# Patient Record
Sex: Male | Born: 1995 | Race: White | Hispanic: Yes | Marital: Married | Smoking: Former smoker
Health system: Southern US, Community
[De-identification: ages and names within clinical notes are randomized; demographics above are authoritative.]

## PROBLEM LIST (undated history)

## (undated) DIAGNOSIS — J45909 Unspecified asthma, uncomplicated: Secondary | ICD-10-CM

## (undated) DIAGNOSIS — F419 Anxiety disorder, unspecified: Secondary | ICD-10-CM

## (undated) HISTORY — PX: NO PAST SURGERIES: SHX2092

---
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
## 2017-04-13 IMAGING — CR DG CHEST 2V
1 series · 2 of 2 positions shown · non-contrast
Comparison: Chest radiograph performed 12/18/2015

CLINICAL DATA: Acute onset of shortness of breath. Follow-up
right-sided chest tube. Initial encounter.

EXAM:
CHEST  2 VIEW

[Series 1: dg chest 2 view · 0.14mm/px · 2 of 2 slices shown]
[im 1/2]
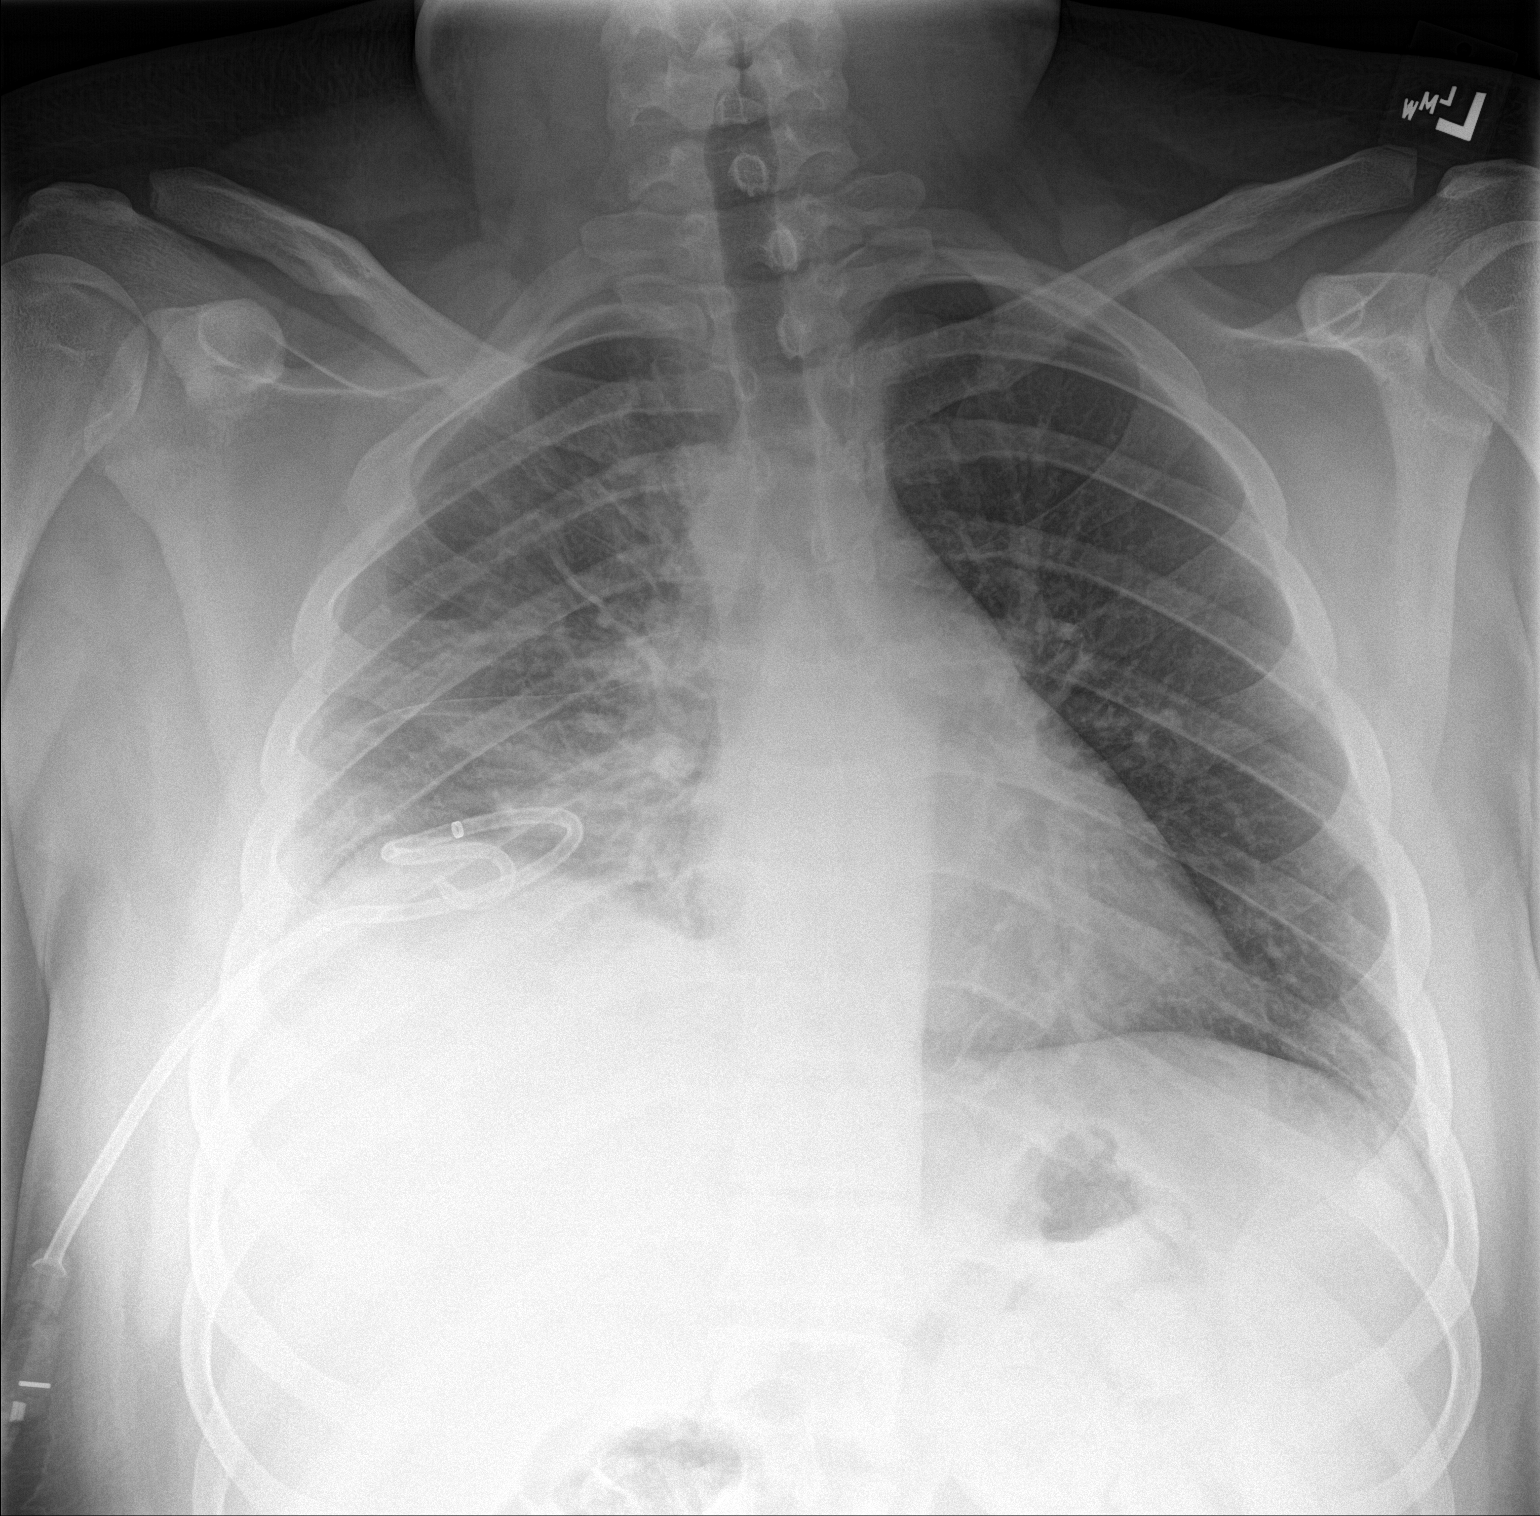
[im 2/2]
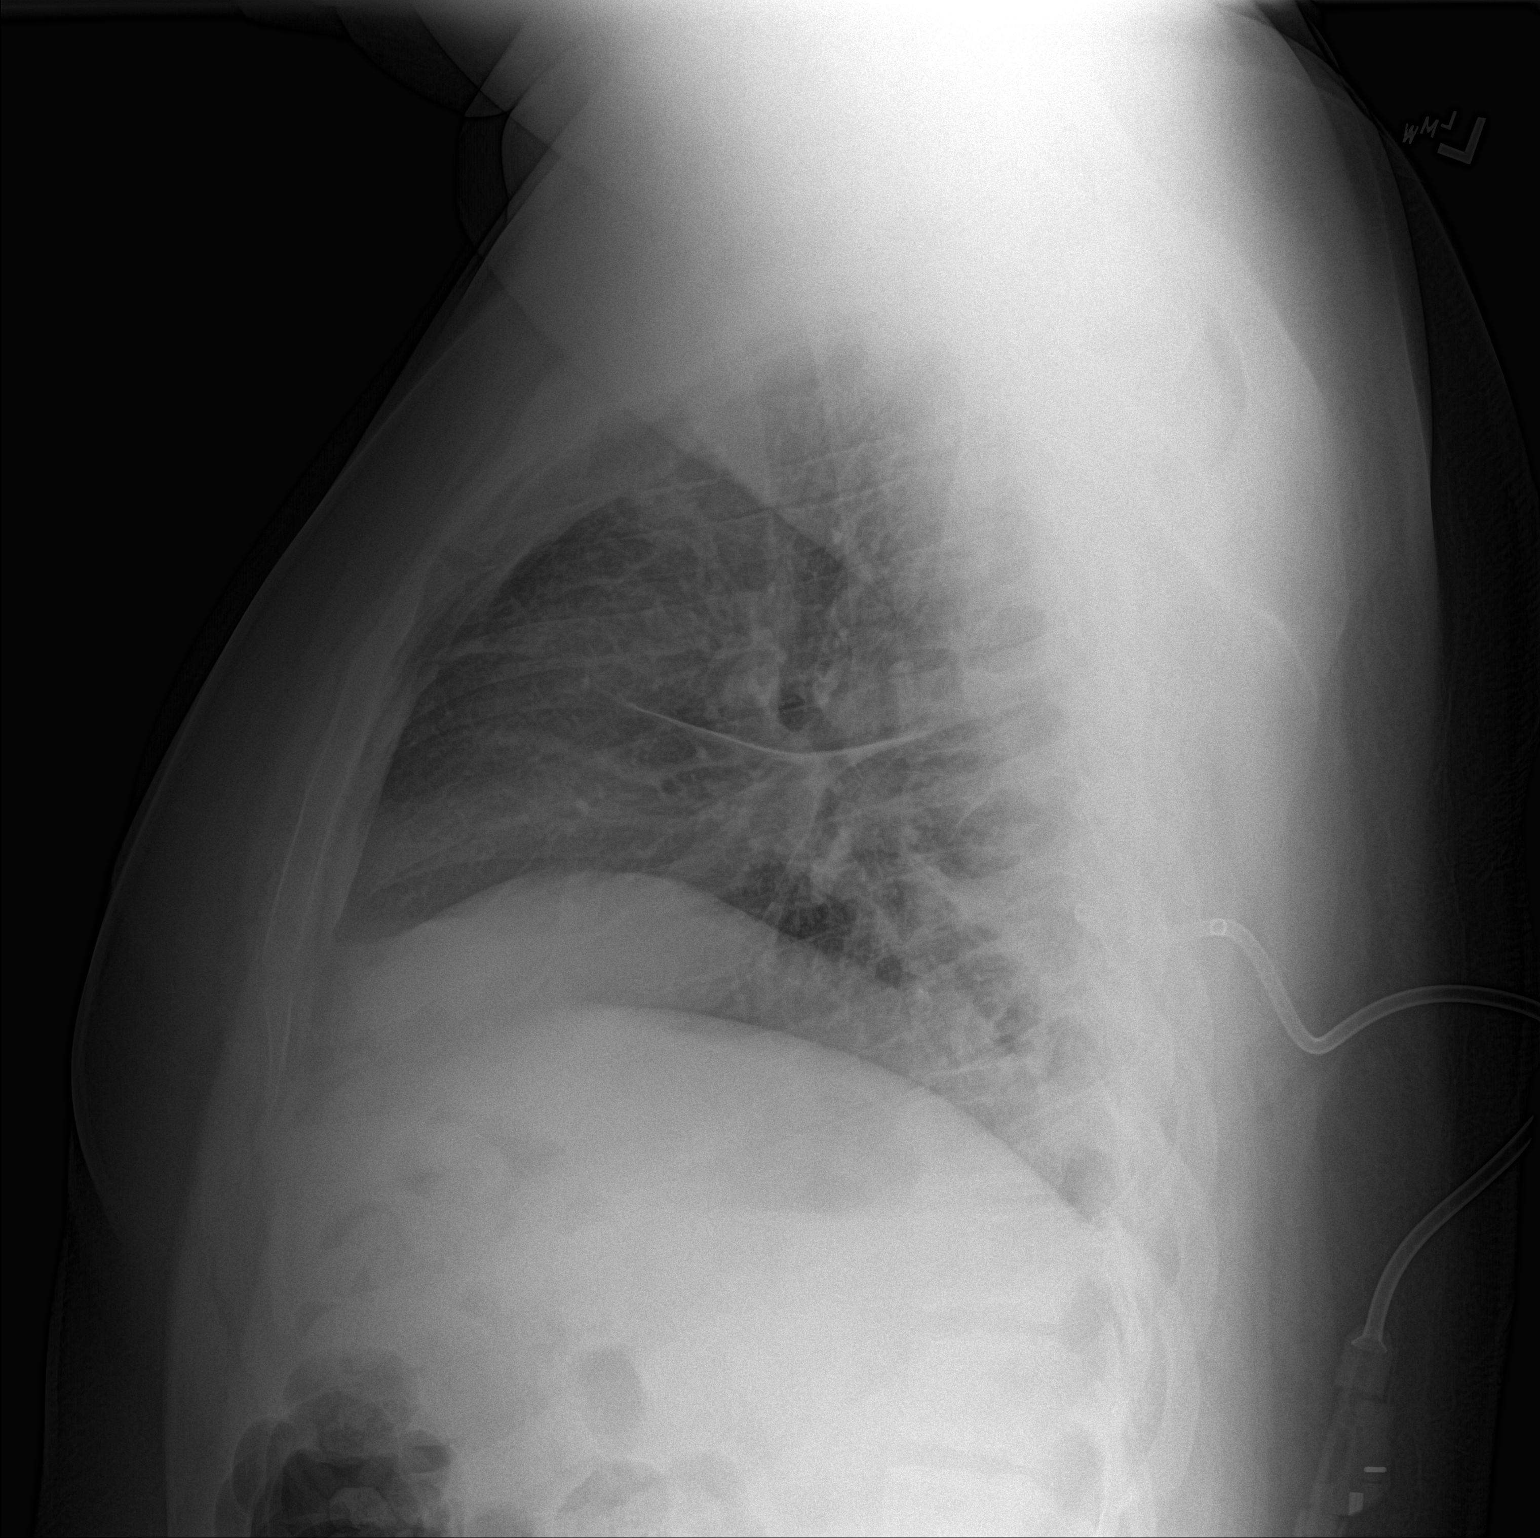

[2 of 2 positions shown; findings below may reference images not displayed]

FINDINGS: There is mild hypoexpansion of the right lung, with a right basilar
chest tube noted in generally unchanged position. Mild vascular
congestion is noted. No pleural effusion or pneumothorax is seen.
Mild right-sided opacity may reflect atelectasis or possibly mild
pneumonia.

The heart remains normal in size. No acute osseous abnormalities are
identified.
IMPRESSION: Right basilar chest tube noted in generally unchanged position.
Right lung mildly hypoexpanded. Mild right-sided airspace opacity
may reflect atelectasis or possibly mild pneumonia.

## 2017-04-15 IMAGING — CR DG CHEST 2V
1 series · 2 of 2 positions shown · non-contrast
Comparison: 12/20/2015

CLINICAL DATA: Right side chest tube

EXAM:
CHEST  2 VIEW

[Series 1: dg chest 2 view · 0.14mm/px · 2 of 2 slices shown]
[im 1/2]
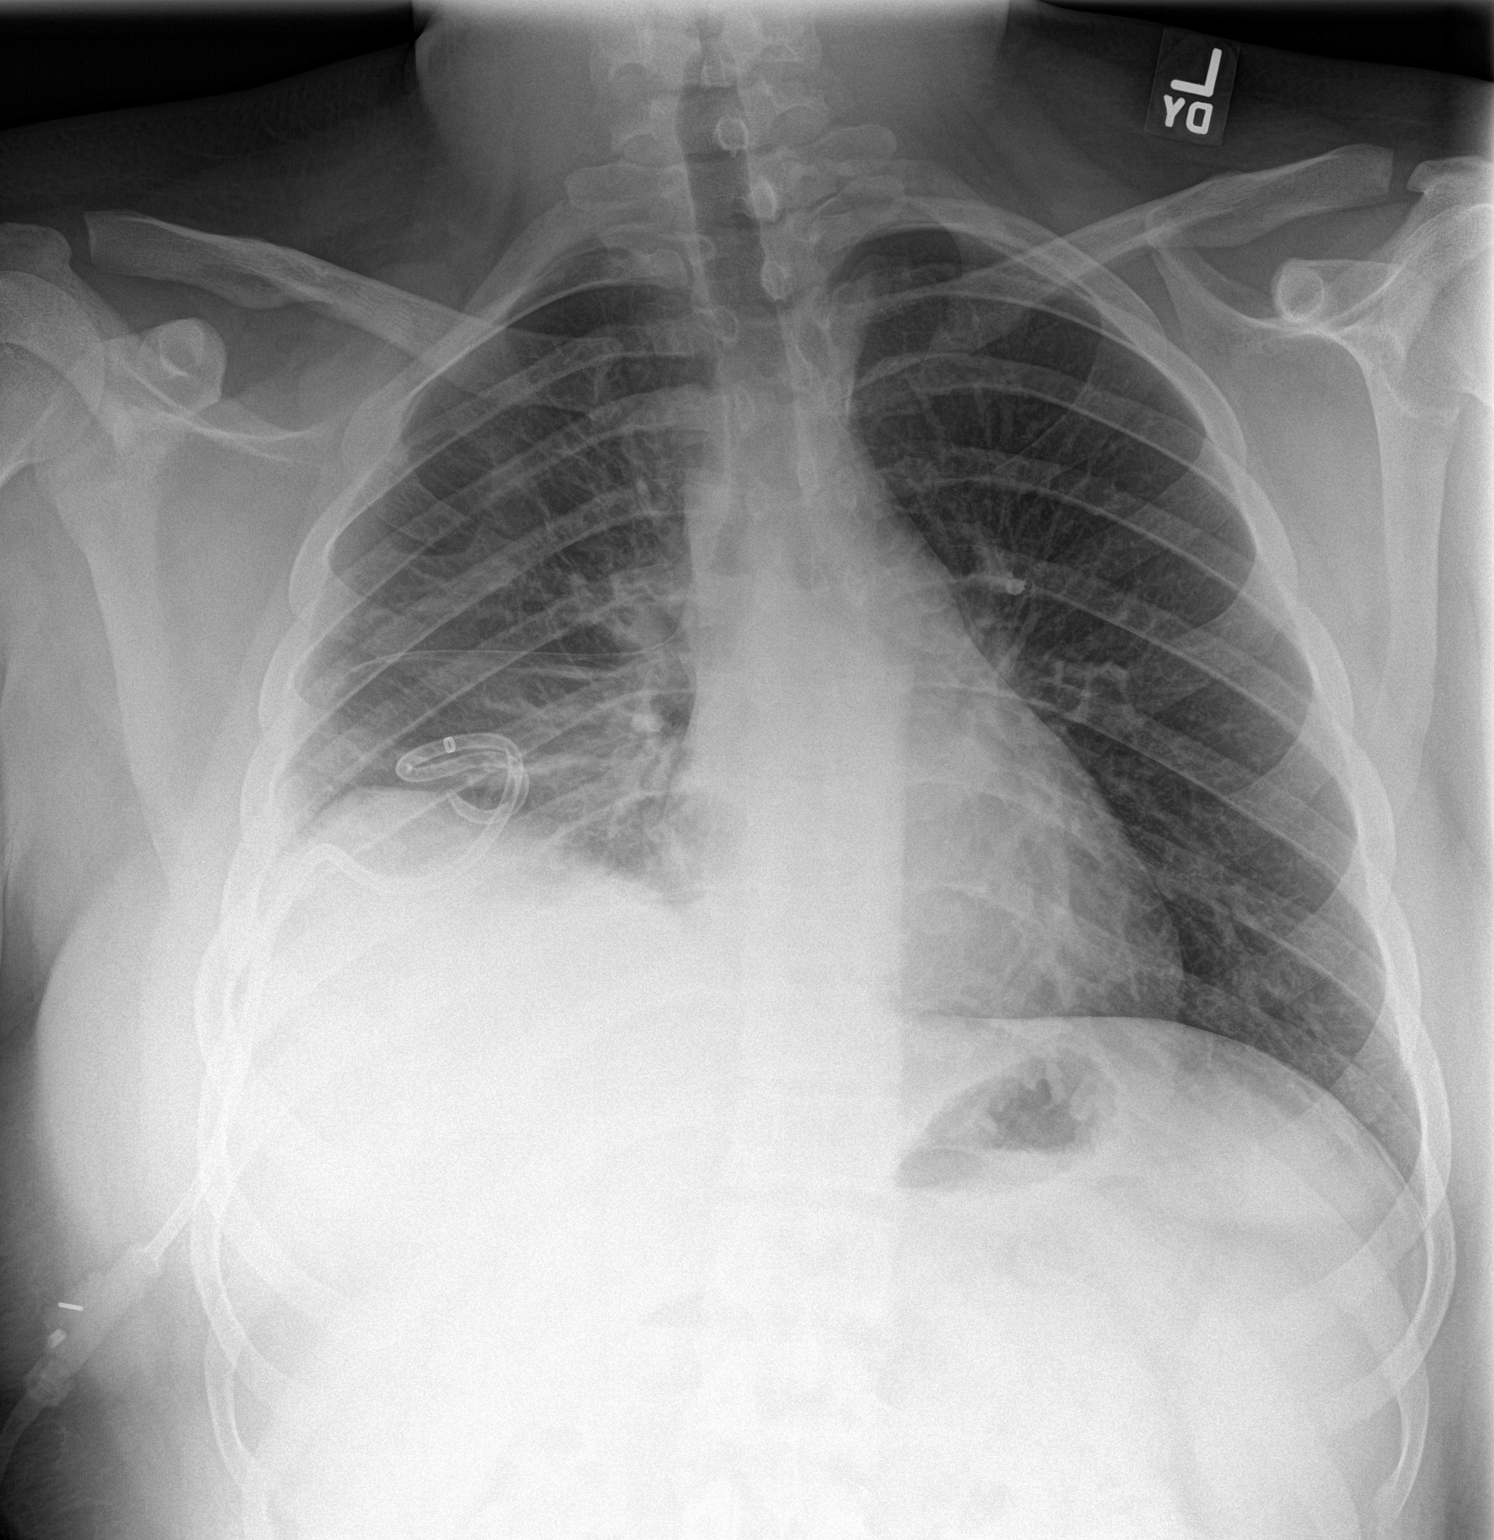
[im 2/2]
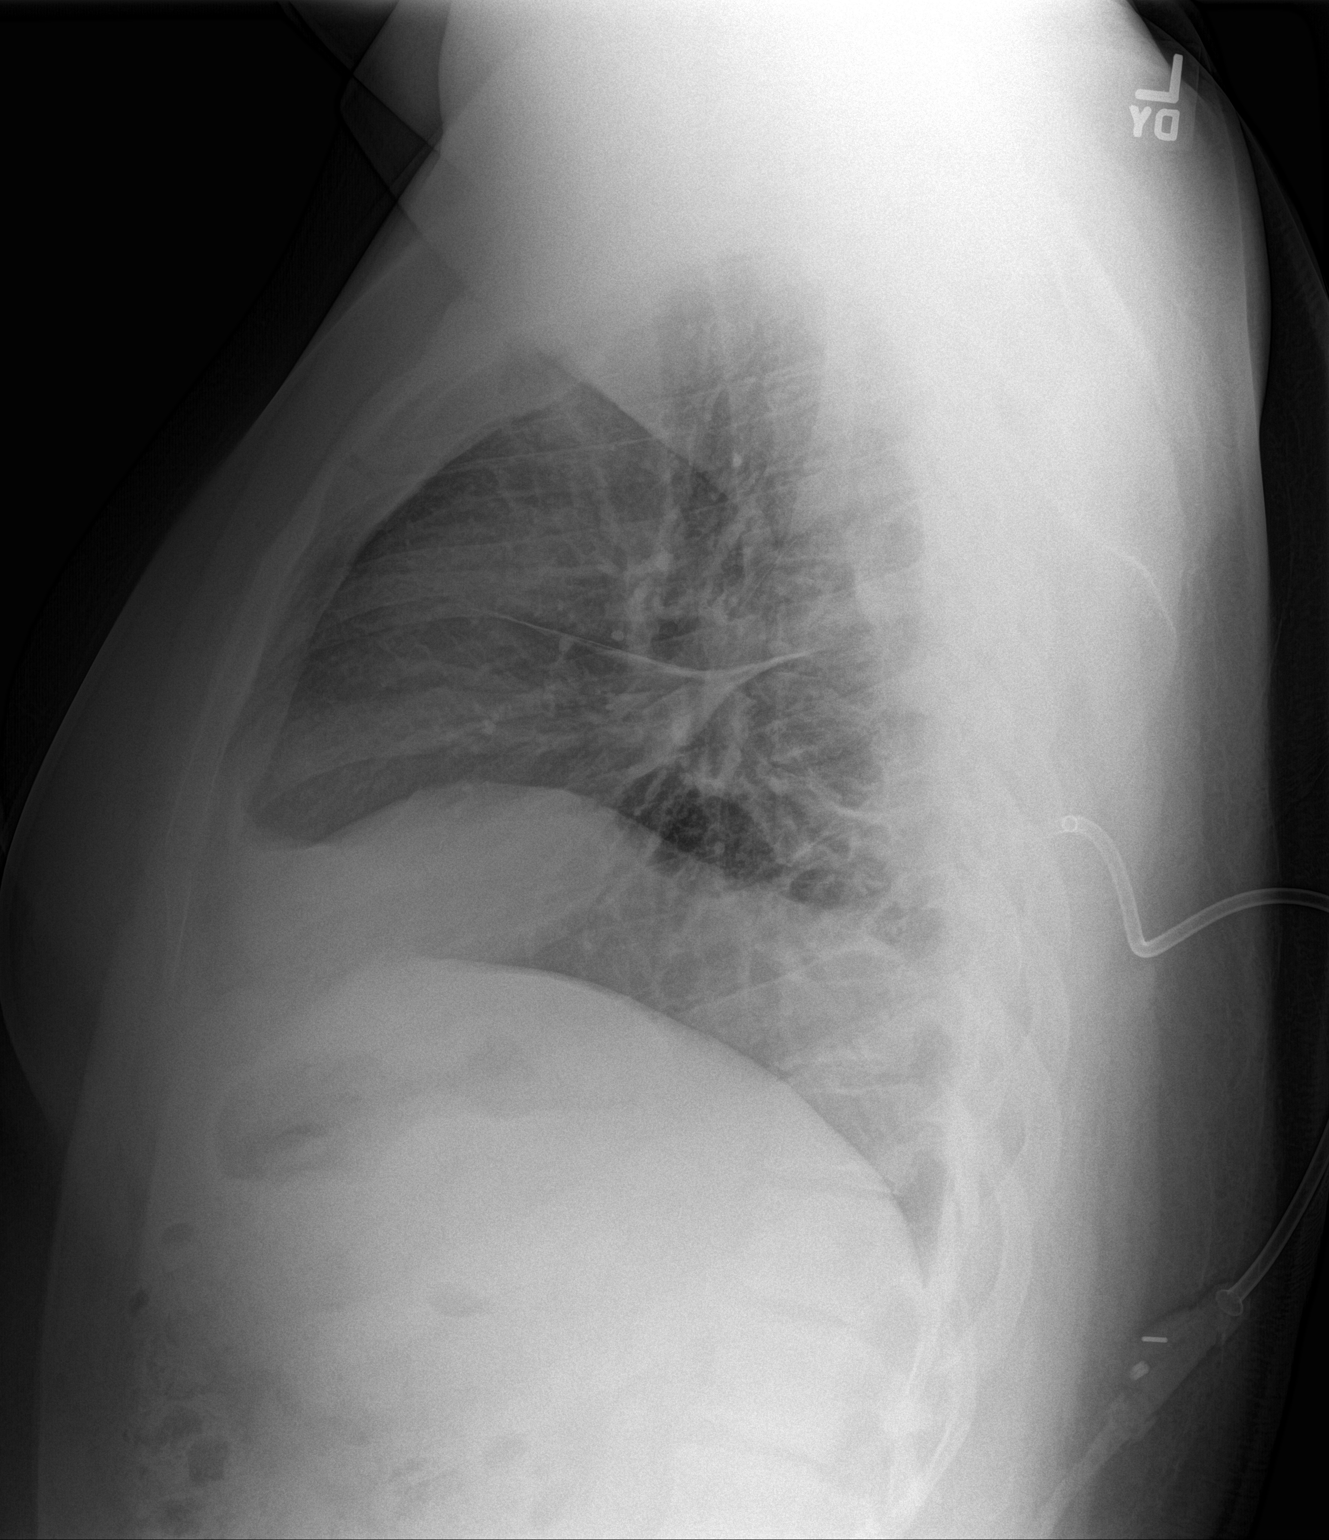

[2 of 2 positions shown; findings below may reference images not displayed]

FINDINGS: Right basilar pleural catheter is unchanged in position. Persistent
right basilar atelectasis. Small loculated right upper pleural
effusion is stable in appearance from prior exam. Left lung is
clear. No pulmonary edema. There is no pneumothorax.
IMPRESSION: Stable right base pleural catheter. Again noted right basilar
atelectasis or scarring. Small loculated right upper pleural
effusion or scarring is stable. No pulmonary edema. Left lung is
clear. No pneumothorax.

## 2017-09-29 ENCOUNTER — Other Ambulatory Visit: Payer: Self-pay

## 2017-09-29 ENCOUNTER — Emergency Department
Admission: EM | Admit: 2017-09-29 | Discharge: 2017-09-29 | Disposition: A | Discharge status: Home / Self Care | Payer: Self-pay | Attending: Emergency Medicine | Admitting: Emergency Medicine

## 2017-09-29 ENCOUNTER — Encounter: Payer: Self-pay | Admitting: Emergency Medicine

## 2017-09-29 DIAGNOSIS — F410 Panic disorder [episodic paroxysmal anxiety] without agoraphobia: Secondary | ICD-10-CM | POA: Insufficient documentation

## 2017-09-29 DIAGNOSIS — J45909 Unspecified asthma, uncomplicated: Secondary | ICD-10-CM | POA: Insufficient documentation

## 2017-09-29 DIAGNOSIS — Z79899 Other long term (current) drug therapy: Secondary | ICD-10-CM | POA: Insufficient documentation

## 2017-09-29 DIAGNOSIS — F419 Anxiety disorder, unspecified: Secondary | ICD-10-CM | POA: Insufficient documentation

## 2017-09-29 DIAGNOSIS — F17210 Nicotine dependence, cigarettes, uncomplicated: Secondary | ICD-10-CM | POA: Insufficient documentation

## 2017-09-29 HISTORY — DX: Anxiety disorder, unspecified: F41.9

## 2017-09-29 MED ORDER — ALBUTEROL SULFATE HFA 108 (90 BASE) MCG/ACT IN AERS: 2.0000 | INHALATION_SPRAY | Freq: Four times a day (QID) | RESPIRATORY_TRACT | 2 refills | Status: DC | PRN

## 2017-09-29 NOTE — ED Provider Notes (Signed)
[facility] Emergency Department Provider Note  ____________________________________________   I have reviewed the triage vital signs and the nursing notes. Where available I have reviewed prior notes and, if possible and indicated, outside hospital notes.    HISTORY  Chief Complaint Anxiety    HPI Jeffrey Joseph is a 22 y.o. male  With a history of panic attacks as well as history of asthma.  He left his asthma medications and in [place] when he moved up here and has not needed them recently but he would like a refill as a side note.  He is here because he had a panic attack after a verbal interaction with his boss at [employer].  It was not physical, he has no SI or HI, he felt he was having a panic attack he calmed down is no longer is.  He feels no other complaints at this time he does need a work note to return to work he states.    Past Medical History:  Diagnosis Date  . Anxiety   . Asthma     Patient Active Problem List   Diagnosis Date Noted  . Empyema (HCC)   . Community acquired pneumonia 12/13/2015  . Sepsis (HCC) 12/13/2015    Past Surgical History:  Procedure Laterality Date  . NO PAST SURGERIES      Prior to Admission medications   Medication Sig Start Date End Date Taking? Authorizing Provider  albuterol (PROVENTIL HFA;VENTOLIN HFA) 108 (90 Base) MCG/ACT inhaler Inhale 2 puffs into the lungs every 6 (six) hours as needed for wheezing or shortness of breath. 09/29/17   Jeanmarie Plant, MD  cefUROXime (CEFTIN) 500 MG tablet Take 1 tablet (500 mg total) by mouth 2 (two) times daily with a meal. 12/21/15   Altamese Dilling, MD  oxyCODONE-acetaminophen (ROXICET) 5-325 MG tablet Take 1 tablet by mouth every 6 (six) hours as needed for moderate pain or severe pain. 12/21/15   Altamese Dilling, MD  polyethylene glycol (MIRALAX / GLYCOLAX) packet Take 17 g by mouth daily as needed for mild constipation or moderate constipation. 12/21/15    Altamese Dilling, MD    Allergies Patient has no known allergies.  Family History  Problem Relation Age of Onset  . Diabetes Mother   . Kidney failure Mother   . Diabetes Father   . Diabetes Paternal Grandfather     Social History Social History   Tobacco Use  . Smoking status: Current Every Day Smoker    Types: Cigars, E-cigarettes  . Smokeless tobacco: Current User  Substance Use Topics  . Alcohol use: No  . Drug use: No    Review of Systems Constitutional: No fever/chills Eyes: No visual changes. ENT: No sore throat. No stiff neck no neck pain Cardiovascular: Denies chest pain. Respiratory:  short of breath when he was having a panic attack no longer is Gastrointestinal:   no vomiting.  No diarrhea.  No constipation. Genitourinary: Negative for dysuria. Musculoskeletal: Negative lower extremity swelling Skin: Negative for rash. Neurological: Negative for severe headaches, focal weakness or numbness.   ____________________________________________   PHYSICAL EXAM:  VITAL SIGNS: ED Triage Vitals  Enc Vitals Group     BP 09/29/17 2034 126/72     Pulse Rate 09/29/17 2034 77     Resp 09/29/17 2034 20     Temp 09/29/17 2034 98.7 F (37.1 C)     Temp Source 09/29/17 2034 Oral     SpO2 09/29/17 2034 98 %  Weight 09/29/17 2033 (!) 315 lb (142.9 kg)     Height 09/29/17 2033  (1.93 m)     Head Circumference --      Peak Flow --      Pain Score 09/29/17 2032 7     Pain Loc --      Pain Edu? --      Excl. in GC? --     Constitutional: Alert and oriented. Well appearing and in no acute distress. Eyes: Conjunctivae are normal Head: Atraumatic HEENT: No congestion/rhinnorhea. Mucous membranes are moist.  Oropharynx non-erythematous Neck:   Nontender with no meningismus, no masses, no stridor Cardiovascular: Normal rate, regular rhythm. Grossly normal heart sounds.  Good peripheral circulation. Respiratory: Normal respiratory effort.  No  retractions. Lungs CTAB. Abdominal: Soft and nontender. No distention. No guarding no rebound Back:  There is no focal tenderness or step off.  there is no midline tenderness there are no lesions noted. there is no CVA tenderness  Musculoskeletal: No lower extremity tenderness, no upper extremity tenderness. No joint effusions, no DVT signs strong distal pulses no edema Neurologic:  Normal speech and language. No gross focal neurologic deficits are appreciated.  Skin:  Skin is warm, dry and intact. No rash noted. Psychiatric: Mood and affect are normal. Speech and behavior are normal.  ____________________________________________   LABS (all labs ordered are listed, but only abnormal results are displayed)  Labs Reviewed - No data to display  Pertinent labs  results that were available during my care of the patient were reviewed by me and considered in my medical decision making (see chart for details). ____________________________________________  EKG  I personally interpreted any EKGs ordered by me or triage Sinus rhythm rate 71 bpm, repull abnormality noted, no acute ST elevation or depression normal axis ____________________________________________  RADIOLOGY  Pertinent labs & imaging results that were available during my care of the patient were reviewed by me and considered in my medical decision making (see chart for details). If possible, patient and/or family made aware of any abnormal findings.  No results found. ____________________________________________    PROCEDURES  Procedure(s) performed: None  Procedures  Critical Care performed: None  ____________________________________________   INITIAL IMPRESSION / ASSESSMENT AND PLAN / ED COURSE  Pertinent labs & imaging results that were available during my care of the patient were reviewed by me and considered in my medical decision making (see chart for details).  In here after what he describes as a panic  attack, he has no signs or symptoms of anything at this time, he would like a work note so he can go back to work after he had a visit verbal altercation with his boss.  We will give him prescription for albuterol is not wheezing now but he thinks that he should have it at home and he used to have it he does not have a doctor we will refer him for outpatient follow-up.  He has no SI or HI, and is safe at this time ability for discharge nothing at this time to suggest ACS PE dissection myocarditis endocarditis pneumonia pneumothorax, pericarditis etc.    ____________________________________________   FINAL CLINICAL IMPRESSION(S) / ED DIAGNOSES  Final diagnoses:  Anxiety attack      This chart was dictated using voice recognition software.  Despite best efforts to proofread,  errors can occur which can change meaning.      Jeanmarie Plant, MD 09/29/17 2145

## 2017-09-29 NOTE — Discharge Instructions (Addendum)
Return to the emergency room for any new or worrisome symptoms including chest pain, shortness of breath or if you feel worse in any way, please follow closely with primary care doctor and [facility] services.

## 2017-09-29 NOTE — ED Triage Notes (Signed)
Patient states he has h/o anxiety, feels as though he had a panic attack. States he has h/o the same, but feels like this time was worse. States he became short of breath during panic attack. 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 Patient ambulatory to triage without difficulty.

## 2018-01-01 ENCOUNTER — Other Ambulatory Visit: Payer: Self-pay

## 2018-01-01 ENCOUNTER — Emergency Department
Admission: EM | Admit: 2018-01-01 | Discharge: 2018-01-01 | Disposition: A | Discharge status: Home / Self Care | Payer: Medicaid Other | Attending: Emergency Medicine | Admitting: Emergency Medicine

## 2018-01-01 DIAGNOSIS — B9689 Other specified bacterial agents as the cause of diseases classified elsewhere: Secondary | ICD-10-CM | POA: Insufficient documentation

## 2018-01-01 DIAGNOSIS — J45909 Unspecified asthma, uncomplicated: Secondary | ICD-10-CM | POA: Insufficient documentation

## 2018-01-01 DIAGNOSIS — F17290 Nicotine dependence, other tobacco product, uncomplicated: Secondary | ICD-10-CM | POA: Insufficient documentation

## 2018-01-01 DIAGNOSIS — L089 Local infection of the skin and subcutaneous tissue, unspecified: Secondary | ICD-10-CM | POA: Insufficient documentation

## 2018-01-01 DIAGNOSIS — Z79899 Other long term (current) drug therapy: Secondary | ICD-10-CM | POA: Insufficient documentation

## 2018-01-01 MED ORDER — SULFAMETHOXAZOLE-TRIMETHOPRIM 800-160 MG PO TABS
1.0000 | ORAL_TABLET | Freq: Once | ORAL | Status: AC
  Administered 2018-01-01: 1 via ORAL
  Filled 2018-01-01: qty 1

## 2018-01-01 MED ORDER — TRAMADOL HCL 50 MG PO TABS: 50.0000 mg | ORAL_TABLET | Freq: Two times a day (BID) | ORAL | 0 refills | Status: DC | PRN

## 2018-01-01 MED ORDER — NAPROXEN 500 MG PO TABS
500.0000 mg | ORAL_TABLET | Freq: Once | ORAL | Status: AC
Start: 2018-01-01 — End: 2018-01-01
  Administered 2018-01-01: 500 mg via ORAL
  Filled 2018-01-01: qty 1

## 2018-01-01 MED ORDER — NAPROXEN 500 MG PO TABS: 500.0000 mg | ORAL_TABLET | Freq: Two times a day (BID) | ORAL | Status: DC

## 2018-01-01 MED ORDER — SULFAMETHOXAZOLE-TRIMETHOPRIM 800-160 MG PO TABS: 1.0000 | ORAL_TABLET | Freq: Two times a day (BID) | ORAL | 0 refills | Status: DC

## 2018-01-01 MED ORDER — OXYCODONE-ACETAMINOPHEN 5-325 MG PO TABS
1.0000 | ORAL_TABLET | Freq: Once | ORAL | Status: AC
Start: 2018-01-01 — End: 2018-01-01
  Administered 2018-01-01: 1 via ORAL
  Filled 2018-01-01: qty 1

## 2018-01-01 NOTE — Discharge Instructions (Signed)
Take medication as directed.

## 2018-01-01 NOTE — ED Triage Notes (Signed)
Left sided groin pain X 2 days. No injury. Denies urinary sx. Pt denies testicular pain. Denies any abnormal swelling "at the surface".  Pt alert and oriented X4, active, cooperative, pt in NAD. RR even and unlabored, color WNL.

## 2018-01-01 NOTE — ED Provider Notes (Signed)
**Note Jeffrey-Identified via Obfuscation** [facility] Emergency Department Provider Note   ____________________________________________   First MD Initiated Contact with Patient 01/01/18 1551     (approximate)  I have reviewed the triage vital signs and the nursing notes.   HISTORY  Chief Complaint Groin Pain    HPI Jeffrey Joseph is a 22 y.o. male patient complain of pain medial thigh fold area for 2 days.  Patient stated no provocative incident for complaint.  It is noted that the patient works outside and he states he perspires heavily.  Patient described the pain is "sharp".  Patient states decreased pain when he keeps his legs spread.  Rates the pain as a 8/10.  No palliative measures for complaint.  Past Medical History:  Diagnosis Date  . Anxiety   . Asthma     Patient Active Problem List   Diagnosis Date Noted  . Empyema (HCC)   . Community acquired pneumonia 12/13/2015  . Sepsis (HCC) 12/13/2015    Past Surgical History:  Procedure Laterality Date  . NO PAST SURGERIES      Prior to Admission medications   Medication Sig Start Date End Date Taking? Authorizing Provider  albuterol (PROVENTIL HFA;VENTOLIN HFA) 108 (90 Base) MCG/ACT inhaler Inhale 2 puffs into the lungs every 6 (six) hours as needed for wheezing or shortness of breath. 09/29/17   Jeanmarie PlantMcShane, James A, MD  cefUROXime (CEFTIN) 500 MG tablet Take 1 tablet (500 mg total) by mouth 2 (two) times daily with a meal. 12/21/15   Altamese DillingVachhani, Vaibhavkumar, MD  naproxen (NAPROSYN) 500 MG tablet Take 1 tablet (500 mg total) by mouth 2 (two) times daily with a meal. 01/01/18   Joni ReiningSmith, Ronald K, PA-C  oxyCODONE-acetaminophen (ROXICET) 5-325 MG tablet Take 1 tablet by mouth every 6 (six) hours as needed for moderate pain or severe pain. 12/21/15   Altamese DillingVachhani, Vaibhavkumar, MD  polyethylene glycol (MIRALAX / GLYCOLAX) packet Take 17 g by mouth daily as needed for mild constipation or moderate constipation. 12/21/15   Altamese DillingVachhani, Vaibhavkumar, MD    sulfamethoxazole-trimethoprim (BACTRIM DS,SEPTRA DS) 800-160 MG tablet Take 1 tablet by mouth 2 (two) times daily. 01/01/18   Joni ReiningSmith, Ronald K, PA-C  traMADol (ULTRAM) 50 MG tablet Take 1 tablet (50 mg total) by mouth every 12 (twelve) hours as needed. 01/01/18   Joni ReiningSmith, Ronald K, PA-C    Allergies Patient has no known allergies.  Family History  Problem Relation Age of Onset  . Diabetes Mother   . Kidney failure Mother   . Diabetes Father   . Diabetes Paternal Grandfather     Social History Social History   Tobacco Use  . Smoking status: Current Every Day Smoker    Types: Cigars, E-cigarettes  . Smokeless tobacco: Current User  Substance Use Topics  . Alcohol use: No  . Drug use: No    Review of Systems Constitutional: No fever/chills Eyes: No visual changes. ENT: No sore throat. Cardiovascular: Denies chest pain. Respiratory: Denies shortness of breath. Gastrointestinal: No abdominal pain.  No nausea, no vomiting.  No diarrhea.  No constipation. Genitourinary: Negative for dysuria. Musculoskeletal: Negative for back pain. Skin: Negative for rash.  Neurological: Negative for headaches, focal weakness or numbness.   ____________________________________________   PHYSICAL EXAM:  VITAL SIGNS: ED Triage Vitals  Enc Vitals Group     BP 01/01/18 1547 138/74     Pulse Rate 01/01/18 1547 88     Resp 01/01/18 1547 16     Temp 01/01/18 1547 98.5 F (36.9  C)     Temp Source 01/01/18 1547 Oral     SpO2 01/01/18 1547 98 %     Weight 01/01/18 1548 220 lb (99.8 kg)     Height 01/01/18 1548 6\' 4"  (1.93 m)     Head Circumference --      Peak Flow --      Pain Score 01/01/18 1548 8     Pain Loc --      Pain Edu? --      Excl. in GC? --     Constitutional: Alert and oriented. Well appearing and in no acute distress. Cardiovascular: Normal rate, regular rhythm. Grossly normal heart sounds.  Good peripheral circulation. Respiratory: Normal respiratory effort.  No  retractions. Lungs CTAB. Musculoskeletal: No lower extremity tenderness nor edema.  No joint effusions. Neurologic:  Normal speech and language. No gross focal neurologic deficits are appreciated. No gait instability. Skin: The skin at the medial thigh fold area is erythematous with moderate guarding palpation.  No palpable lesion.  Psychiatric: Mood and affect are normal. Speech and behavior are normal.  ____________________________________________   LABS (all labs ordered are listed, but only abnormal results are displayed)  Labs Reviewed - No data to display ____________________________________________  EKG   ____________________________________________  RADIOLOGY  ED MD interpretation:    Official radiology report(s): No results found.  ____________________________________________   PROCEDURES  Procedure(s) performed: None  Procedures  Critical Care performed: No  ____________________________________________   INITIAL IMPRESSION / ASSESSMENT AND PLAN / ED COURSE  As part of my medical decision making, I reviewed the following data within the electronic MEDICAL RECORD NUMBER    Bacterial skin infection left medial thigh for area.  Patient given discharge care instruction.  Patient advised take medication as directed.  Patient advised to try to keep area clean and dry.  Advised to follow-up with the community health clinic if condition persist.      ____________________________________________   FINAL CLINICAL IMPRESSION(S) / ED DIAGNOSES  Final diagnoses:  Localized bacterial skin infection     ED Discharge Orders         Ordered    sulfamethoxazole-trimethoprim (BACTRIM DS,SEPTRA DS) 800-160 MG tablet  2 times daily     01/01/18 1645    naproxen (NAPROSYN) 500 MG tablet  2 times daily with meals     01/01/18 1645    traMADol (ULTRAM) 50 MG tablet  Every 12 hours PRN     01/01/18 1645           Note:  This document was prepared using Dragon  voice recognition software and may include unintentional dictation errors.    Joni Reining, PA-C 01/01/18 1653    Merrily Brittle, MD 01/01/18 2030

## 2018-01-01 NOTE — ED Notes (Signed)
See triage note  Presents with pain to groin area for 2 days  Pain is mainly on the left  Denies any injury or urinary sx's

## 2018-07-31 ENCOUNTER — Other Ambulatory Visit: Payer: Self-pay

## 2018-07-31 ENCOUNTER — Encounter: Payer: Self-pay | Admitting: Emergency Medicine

## 2018-07-31 ENCOUNTER — Emergency Department: Payer: Medicaid Other

## 2018-07-31 ENCOUNTER — Emergency Department
Admission: EM | Admit: 2018-07-31 | Discharge: 2018-07-31 | Disposition: A | Discharge status: Home / Self Care | Payer: Medicaid Other | Attending: Emergency Medicine | Admitting: Emergency Medicine

## 2018-07-31 DIAGNOSIS — R1011 Right upper quadrant pain: Secondary | ICD-10-CM | POA: Insufficient documentation

## 2018-07-31 DIAGNOSIS — R109 Unspecified abdominal pain: Secondary | ICD-10-CM

## 2018-07-31 DIAGNOSIS — J45909 Unspecified asthma, uncomplicated: Secondary | ICD-10-CM | POA: Insufficient documentation

## 2018-07-31 DIAGNOSIS — Z79899 Other long term (current) drug therapy: Secondary | ICD-10-CM | POA: Insufficient documentation

## 2018-07-31 DIAGNOSIS — R112 Nausea with vomiting, unspecified: Secondary | ICD-10-CM

## 2018-07-31 DIAGNOSIS — R197 Diarrhea, unspecified: Secondary | ICD-10-CM | POA: Insufficient documentation

## 2018-07-31 DIAGNOSIS — F17290 Nicotine dependence, other tobacco product, uncomplicated: Secondary | ICD-10-CM | POA: Insufficient documentation

## 2018-07-31 LAB — COMPREHENSIVE METABOLIC PANEL
ALK PHOS: 49 U/L (ref 38–126)
ALT: 16 U/L (ref 0–44)
ANION GAP: 7 (ref 5–15)
AST: 18 U/L (ref 15–41)
Albumin: 4.5 g/dL (ref 3.5–5.0)
BUN: 11 mg/dL (ref 6–20)
CALCIUM: 9.2 mg/dL (ref 8.9–10.3)
CO2: 26 mmol/L (ref 22–32)
Chloride: 108 mmol/L (ref 98–111)
Creatinine, Ser: 0.8 mg/dL (ref 0.61–1.24)
Glucose, Bld: 98 mg/dL (ref 70–99)
Potassium: 4 mmol/L (ref 3.5–5.1)
SODIUM: 141 mmol/L (ref 135–145)
Total Bilirubin: 0.6 mg/dL (ref 0.3–1.2)
Total Protein: 7.4 g/dL (ref 6.5–8.1)

## 2018-07-31 LAB — CBC
HEMATOCRIT: 45.7 % (ref 39.0–52.0)
HEMOGLOBIN: 15.2 g/dL (ref 13.0–17.0)
MCH: 27.6 pg (ref 26.0–34.0)
MCHC: 33.3 g/dL (ref 30.0–36.0)
MCV: 83.1 fL (ref 80.0–100.0)
Platelets: 283 10*3/uL (ref 150–400)
RBC: 5.5 MIL/uL (ref 4.22–5.81)
RDW: 12.5 % (ref 11.5–15.5)
WBC: 8.4 10*3/uL (ref 4.0–10.5)
nRBC: 0 % (ref 0.0–0.2)

## 2018-07-31 LAB — LIPASE, BLOOD: LIPASE: 22 U/L (ref 11–51)

## 2018-07-31 MED ORDER — FAMOTIDINE 20 MG PO TABS
20.0000 mg | ORAL_TABLET | Freq: Once | ORAL | Status: AC
Start: 2018-07-31 — End: 2018-07-31
  Administered 2018-07-31: 20 mg via ORAL
  Filled 2018-07-31: qty 1

## 2018-07-31 MED ORDER — ALUM & MAG HYDROXIDE-SIMETH 200-200-20 MG/5ML PO SUSP
15.0000 mL | Freq: Once | ORAL | Status: AC
  Administered 2018-07-31: 15 mL via ORAL
  Filled 2018-07-31: qty 30

## 2018-07-31 MED ORDER — ONDANSETRON 4 MG PO TBDP
4.0000 mg | ORAL_TABLET | Freq: Once | ORAL | Status: AC
Start: 2018-07-31 — End: 2018-07-31
  Administered 2018-07-31: 4 mg via ORAL
  Filled 2018-07-31: qty 1

## 2018-07-31 MED ORDER — ONDANSETRON 4 MG PO TBDP: 4.0000 mg | ORAL_TABLET | Freq: Four times a day (QID) | ORAL | 0 refills | Status: DC | PRN

## 2018-07-31 NOTE — Discharge Instructions (Signed)
? ?  Please return to the emergency room right away if you are to develop a fever, severe nausea, your pain becomes severe or worsens, you are unable to keep food down, begin vomiting any dark or bloody fluid, you develop any dark or bloody stools, feel dehydrated, or other new concerns or symptoms arise. ? ?

## 2018-07-31 NOTE — ED Notes (Signed)
Pt states that he had vomiting x2 and diarrhea x1 today- having some sharp stomach pains in the upper right abdomen

## 2018-07-31 NOTE — ED Provider Notes (Signed)
[facility] Emergency Department Provider Note   ____________________________________________   First MD Initiated Contact with Patient 07/31/18 1538     (approximate)  I have reviewed the triage vital signs and the nursing notes.   HISTORY  Chief Complaint Emesis and Diarrhea    HPI Jeffrey Joseph is a 23 y.o. male nausea and vomiting, vomited once this morning and one loose stool nonbloody.  Reports he had a sharp discomfort in his right upper abdomen around his right lower part of the rib cage in his abdomen during the episode which occurred a couple hours ago.  Did not want to eat anything, reports he went to [facility] but did not feel like he could eat it.  No ongoing vomiting.  Slight feeling of a little bit of indigestion feeling.  No chest pain or trouble breathing.  No shortness of breath or wheezing.  No cough no fevers or chills.  Reports overall he is feeling quite a bit better right now, just has a little bit of a discomfort or soreness located just in the right upper abdomen and none in the right lower abdomen or left side   Past Medical History:  Diagnosis Date  . Anxiety   . Asthma     Patient Active Problem List   Diagnosis Date Noted  . Empyema (HCC)   . Community acquired pneumonia 12/13/2015  . Sepsis (HCC) 12/13/2015    Past Surgical History:  Procedure Laterality Date  . NO PAST SURGERIES      Prior to Admission medications   Medication Sig Start Date End Date Taking? Authorizing Provider  albuterol (PROVENTIL HFA;VENTOLIN HFA) 108 (90 Base) MCG/ACT inhaler Inhale 2 puffs into the lungs every 6 (six) hours as needed for wheezing or shortness of breath. 09/29/17   Jeanmarie Plant, MD  cefUROXime (CEFTIN) 500 MG tablet Take 1 tablet (500 mg total) by mouth 2 (two) times daily with a meal. 12/21/15   Altamese Dilling, MD  naproxen (NAPROSYN) 500 MG tablet Take 1 tablet (500 mg total) by mouth 2 (two) times daily with a  meal. 01/01/18   Joni Reining, PA-C  ondansetron (ZOFRAN ODT) 4 MG disintegrating tablet Take 1 tablet (4 mg total) by mouth every 6 (six) hours as needed for nausea or vomiting. 07/31/18   Sharyn Creamer, MD  oxyCODONE-acetaminophen (ROXICET) 5-325 MG tablet Take 1 tablet by mouth every 6 (six) hours as needed for moderate pain or severe pain. 12/21/15   Altamese Dilling, MD  polyethylene glycol (MIRALAX / GLYCOLAX) packet Take 17 g by mouth daily as needed for mild constipation or moderate constipation. 12/21/15   Altamese Dilling, MD  sulfamethoxazole-trimethoprim (BACTRIM DS,SEPTRA DS) 800-160 MG tablet Take 1 tablet by mouth 2 (two) times daily. 01/01/18   Joni Reining, PA-C  traMADol (ULTRAM) 50 MG tablet Take 1 tablet (50 mg total) by mouth every 12 (twelve) hours as needed. 01/01/18   Joni Reining, PA-C  Patient reports he is currently not taking any medication  Allergies Patient has no known allergies.  Family History  Problem Relation Age of Onset  . Diabetes Mother   . Kidney failure Mother   . Diabetes Father   . Diabetes Paternal Grandfather     Social History Social History   Tobacco Use  . Smoking status: Current Every Day Smoker    Types: Cigars, E-cigarettes  . Smokeless tobacco: Current User  Substance Use Topics  . Alcohol use: No  . Drug  use: No    Review of Systems Constitutional: No fever/chills Eyes: No visual changes. ENT: No sore throat. Cardiovascular: Denies chest pain. Respiratory: Denies shortness of breath. Gastrointestinal: See HPI Genitourinary: Negative for dysuria. Musculoskeletal: Negative for back pain. Skin: Negative for rash. Neurological: Negative for headaches, areas of focal weakness or numbness. No travel history.  No exposure to anyone with known coronavirus    ____________________________________________   PHYSICAL EXAM:  VITAL SIGNS: ED Triage Vitals  Enc Vitals Group     BP --      Pulse Rate 07/31/18  1533 79     Resp 07/31/18 1533 16     Temp 07/31/18 1533 98.3 F (36.8 C)     Temp Source 07/31/18 1533 Oral     SpO2 07/31/18 1533 98 %     Weight 07/31/18 1534 220 lb 0.3 oz (99.8 kg)     Height --      Head Circumference --      Peak Flow --      Pain Score 07/31/18 1534 7     Pain Loc --      Pain Edu? --      Excl. in GC? --   BP normal  Constitutional: Alert and oriented. Well appearing and in no acute distress. Eyes: Conjunctivae are normal. Head: Atraumatic. Nose: No congestion/rhinnorhea. Mouth/Throat: Mucous membranes are moist. Neck: No stridor.  Cardiovascular: Normal rate, regular rhythm. Grossly normal heart sounds.  Good peripheral circulation. Respiratory: Normal respiratory effort.  No retractions. Lungs CTAB. Gastrointestinal: Soft and nontender at the present time.  Patient reports his symptoms are overall much better at this time than when they were hurting him earlier.  He has no right lower quadrant tenderness.  There is no focal pain to McBurney's point.  Negative Rovsing negative psoas.  No rebound guarding or evidence of peritonitis in any quadrant no distention. Musculoskeletal: No lower extremity tenderness nor edema. Neurologic:  Normal speech and language. No gross focal neurologic deficits are appreciated.  Skin:  Skin is warm, dry and intact. No rash noted. Psychiatric: Mood and affect are normal. Speech and behavior are normal.  ____________________________________________   LABS (all labs ordered are listed, but only abnormal results are displayed)  Labs Reviewed  COMPREHENSIVE METABOLIC PANEL  LIPASE, BLOOD  CBC   ____________________________________________  EKG  ____________________________________________  RADIOLOGY  US Abdomen Limited Ruq  Result Date: 07/31/2018 CLINICAL DATA:  Abdominal pain for 1 day. EXAM: ULTRASOUND ABDOMEN LIMITED RIGHT UPPER QUADRANT COMPARISON:  None. FINDINGS: Gallbladder: No gallstones or wall  thickening visualized. No sonographic Murphy sign noted by sonographer. Common bile duct: Diameter: 3 mm Liver: No focal lesion identified. Within normal limits in parenchymal echogenicity. Portal vein is patent on color Doppler imaging with normal direction of blood flow towards the liver. IMPRESSION: Normal RIGHT upper quadrant ultrasound. Electronically Signed   By: Bary Richard M.D.   On: 07/31/2018 16:55     Right upper quadrant ultrasound normal ____________________________________________   PROCEDURES  Procedure(s) performed: None  Procedures  Critical Care performed: No  ____________________________________________   INITIAL IMPRESSION / ASSESSMENT AND PLAN / ED COURSE  Pertinent labs & imaging results that were available during my care of the patient were reviewed by me and considered in my medical decision making (see chart for details).   Differential diagnosis includes but is not limited to, abdominal perforation, aortic dissection, cholecystitis, appendicitis, diverticulitis, colitis, esophagitis/gastritis, kidney stone, pyelonephritis, urinary tract infection, viral syndrome, etc. All are considered in  decision and treatment plan. Based upon the patient's presentation and risk factors, he has a very very reassuring clinical exam at this time.  Denies right lower quadrant discomfort.  Afebrile, reports her symptoms overall much improved.  Given the nature of right upper quadrant discomfort nausea vomiting 1 loose stool and a sharp discomfort that accompanied it in the right upper abdomen earlier we will evaluate further for hepatobiliary process.  ----------------------------------------- 5:09 PM on 07/31/2018 -----------------------------------------  Reassuring lab work.  Patient reports pain and symptom-free.  Reexam abdomen soft nontender no pain in any quadrant.  Return precautions and treatment recommendations and follow-up discussed with the patient who is  agreeable with the plan.  Suspect possibly some type of food poisoning, gastritis, or viral type etiology as he has very reassuring lab work and his exam.  He responded well to medication he feels normal now.       ____________________________________________   FINAL CLINICAL IMPRESSION(S) / ED DIAGNOSES  Final diagnoses:  Abdominal pain  Nausea vomiting and diarrhea        Note:  This document was prepared using Dragon voice recognition software and may include unintentional dictation errors       Sharyn CreamerQuale, Raphaella Larkin, MD 07/31/18 1709

## 2018-07-31 NOTE — ED Triage Notes (Signed)
Pt to ed with c/o n/v/d.  Pt states diarrhea x 2, states vomiting x 2.

## 2018-12-13 ENCOUNTER — Emergency Department
Admission: EM | Admit: 2018-12-13 | Discharge: 2018-12-13 | Disposition: A | Discharge status: Home / Self Care | Payer: BLUE CROSS/BLUE SHIELD | Attending: Emergency Medicine | Admitting: Emergency Medicine

## 2018-12-13 ENCOUNTER — Other Ambulatory Visit: Payer: Self-pay

## 2018-12-13 ENCOUNTER — Encounter: Payer: Self-pay | Admitting: Emergency Medicine

## 2018-12-13 DIAGNOSIS — Y939 Activity, unspecified: Secondary | ICD-10-CM | POA: Insufficient documentation

## 2018-12-13 DIAGNOSIS — Z23 Encounter for immunization: Secondary | ICD-10-CM | POA: Insufficient documentation

## 2018-12-13 DIAGNOSIS — Y999 Unspecified external cause status: Secondary | ICD-10-CM | POA: Insufficient documentation

## 2018-12-13 DIAGNOSIS — W57XXXA Bitten or stung by nonvenomous insect and other nonvenomous arthropods, initial encounter: Secondary | ICD-10-CM | POA: Insufficient documentation

## 2018-12-13 DIAGNOSIS — Y92193 Bedroom in other specified residential institution as the place of occurrence of the external cause: Secondary | ICD-10-CM | POA: Insufficient documentation

## 2018-12-13 DIAGNOSIS — Z79899 Other long term (current) drug therapy: Secondary | ICD-10-CM | POA: Insufficient documentation

## 2018-12-13 DIAGNOSIS — S30860A Insect bite (nonvenomous) of lower back and pelvis, initial encounter: Secondary | ICD-10-CM | POA: Diagnosis not present

## 2018-12-13 DIAGNOSIS — F17210 Nicotine dependence, cigarettes, uncomplicated: Secondary | ICD-10-CM | POA: Diagnosis not present

## 2018-12-13 DIAGNOSIS — J45909 Unspecified asthma, uncomplicated: Secondary | ICD-10-CM | POA: Insufficient documentation

## 2018-12-13 MED ORDER — DIPHENHYDRAMINE HCL 25 MG PO CAPS
50.0000 mg | ORAL_CAPSULE | Freq: Once | ORAL | Status: AC
  Administered 2018-12-13: 50 mg via ORAL
  Filled 2018-12-13: qty 2

## 2018-12-13 MED ORDER — TETANUS-DIPHTH-ACELL PERTUSSIS 5-2.5-18.5 LF-MCG/0.5 IM SUSP
0.5000 mL | Freq: Once | INTRAMUSCULAR | Status: AC
  Administered 2018-12-13: 0.5 mL via INTRAMUSCULAR
  Filled 2018-12-13: qty 0.5

## 2018-12-13 MED ORDER — IBUPROFEN 600 MG PO TABS
600.0000 mg | ORAL_TABLET | Freq: Once | ORAL | Status: AC
  Administered 2018-12-13: 600 mg via ORAL

## 2018-12-13 NOTE — ED Triage Notes (Signed)
Patient states that he was bit by a brown recluse spider about 30 minutes ago. Patient with a small area of redness to left lower back.

## 2018-12-13 NOTE — ED Provider Notes (Signed)
**Note Jeffrey-Identified via Obfuscation** [facility] Emergency Department Provider Note  ____________________________________________   [facility]18 [phone]     (approximate)  I have reviewed the triage vital signs and the nursing notes.   HISTORY  Chief Complaint Insect Bite    HPI Jeffrey Joseph is a 23 y.o. male with medical history as listed below who presents for evaluation of what he believes to be a spider bite.  He says that he was lying in bed when he felt a sharp prick in his left lower back.  He reports that he felt that a couple of times and when he leaned forward and felt erratic he feels something crawling in his back.  He slept undergraduate and says that it was a brown spider although his significant other who is also in the room with says that she thinks it was a black widow.  He is right-handed and states that it is severely painful and that there is some redness around a couple of spots on his left lower back.  He denies any muscle cramps, nausea, vomiting, abdominal pain.  He has had no recent fever, sore throat, chest pain, cough, nor shortness of breath, and they have had no contact with known COVID-19 patients.  He does not know the date of his last vaccination for tetanus.         Past Medical History:  Diagnosis Date  . Anxiety   . Asthma     Patient Active Problem List   Diagnosis Date Noted  . Empyema (HCC)   . Community acquired pneumonia 12/13/2015  . Sepsis (HCC) 12/13/2015    Past Surgical History:  Procedure Laterality Date  . NO PAST SURGERIES      Prior to Admission medications   Medication Sig Start Date End Date Taking? Authorizing Provider  albuterol (PROVENTIL HFA;VENTOLIN HFA) 108 (90 Base) MCG/ACT inhaler Inhale 2 puffs into the lungs every 6 (six) hours as needed for wheezing or shortness of breath. 09/29/17   Jeanmarie PlantMcShane, James A, MD  cefUROXime (CEFTIN) 500 MG tablet Take 1 tablet (500 mg total) by mouth 2 (two) times  daily with a meal. 12/21/15   Altamese DillingVachhani, Vaibhavkumar, MD  naproxen (NAPROSYN) 500 MG tablet Take 1 tablet (500 mg total) by mouth 2 (two) times daily with a meal. 01/01/18   Joni ReiningSmith, Ronald K, PA-C  ondansetron (ZOFRAN ODT) 4 MG disintegrating tablet Take 1 tablet (4 mg total) by mouth every 6 (six) hours as needed for nausea or vomiting. 07/31/18   Sharyn CreamerQuale, Mark, MD  oxyCODONE-acetaminophen (ROXICET) 5-325 MG tablet Take 1 tablet by mouth every 6 (six) hours as needed for moderate pain or severe pain. 12/21/15   Altamese DillingVachhani, Vaibhavkumar, MD  polyethylene glycol (MIRALAX / GLYCOLAX) packet Take 17 g by mouth daily as needed for mild constipation or moderate constipation. 12/21/15   Altamese DillingVachhani, Vaibhavkumar, MD  sulfamethoxazole-trimethoprim (BACTRIM DS,SEPTRA DS) 800-160 MG tablet Take 1 tablet by mouth 2 (two) times daily. 01/01/18   Joni ReiningSmith, Ronald K, PA-C  traMADol (ULTRAM) 50 MG tablet Take 1 tablet (50 mg total) by mouth every 12 (twelve) hours as needed. 01/01/18   Joni ReiningSmith, Ronald K, PA-C    Allergies Patient has no known allergies.  Family History  Problem Relation Age of Onset  . Diabetes Mother   . Kidney failure Mother   . Diabetes Father   . Diabetes Paternal Grandfather     Social History Social History   Tobacco Use  . Smoking status:  Current Every Day Smoker    Types: Cigars, E-cigarettes  . Smokeless tobacco: Current User  Substance Use Topics  . Alcohol use: Yes    Comment: occ  . Drug use: No    Review of Systems Constitutional: No fever/chills Eyes: No visual changes. Cardiovascular: Denies chest pain. Respiratory: Denies shortness of breath. Gastrointestinal: No abdominal pain.  No nausea, no vomiting.  No diarrhea.  No constipation. Musculoskeletal: Sharp stabbing pain in the left lower back after possible insect or spider bite. Integumentary: Small area of redness and a raised bump at the site of the bite. Neurological: Negative for headaches, focal weakness or numbness.    ____________________________________________   PHYSICAL EXAM:  VITAL SIGNS: ED Triage Vitals  Enc Vitals Group     BP 12/13/18 0323 135/74     Pulse Rate 12/13/18 0323 72     Resp 12/13/18 0323 18     Temp 12/13/18 0323 98.7 F (37.1 C)     Temp Source 12/13/18 0323 Oral     SpO2 12/13/18 0323 98 %     Weight 12/13/18 0324 117.9 kg (260 lb)     Height 12/13/18 0324 1.93 m (6\' 4" )     Head Circumference --      Peak Flow --      Pain Score 12/13/18 0324 7     Pain Loc --      Pain Edu? --      Excl. in [place]? --     Constitutional: Alert and oriented. Well appearing and in no acute distress. Eyes: Conjunctivae are normal.  Cardiovascular: Normal rate, regular rhythm. Good peripheral circulation. Respiratory: Normal respiratory effort.  No retractions.  Neurologic:  Normal speech and language. No gross focal neurologic deficits are appreciated.  Skin:  Skin is warm, dry and intact.  He has a very small raised lesion on the left lower back with some very minimal surrounding erythema which could be consistent with an insect bite.  There is no hole and no evidence of fluctuance or induration.  It looks more like a small urticarial lesion for mosquito bite than anything else.  There are also 2 similar spots to the left along his lateral lower backslash side that do not have a surrounding erythema.  There is also some scratch marks around the initial lesion which suggest that he has slightly excoriated the area.  They are not consistent with or acute infection. Psychiatric: Mood and affect are a little bit anxious but otherwise unremarkable.  ____________________________________________   LABS (all labs ordered are listed, but only abnormal results are displayed)  Labs Reviewed - No data to display ____________________________________________  EKG  None - EKG not ordered by ED physician ____________________________________________  RADIOLOGY   ED MD interpretation: No  indication for imaging  Official radiology report(s): No results found.  ____________________________________________   PROCEDURES   Procedure(s) performed (including Critical Care):  Procedures   ____________________________________________   INITIAL IMPRESSION / MDM / [place] / ED COURSE  As part of my medical decision making, I reviewed the following data within the [place] notes reviewed and incorporated, Old chart reviewed, Notes from prior ED visits and [place] Controlled Substance Database   Differential diagnosis includes, but is not limited to, insect bite, spider bite, abscess.  The patient is well-appearing and in no acute distress with normal vital signs.  The patient is reporting sharp stabbing pain that is keeping him from sleeping but he does not appear  to be hurting significantly.  The presentation is not consistent with a spider bite although it is theoretically possible he could be early in the process.  However I provided reassurance and explained that treatment is really bad her symptoms are trolled.  Antibiotics are not indicated.  He does not know the date of his last tetanus vaccination so I gave him a Tdap.  I also gave him a dose of Benadryl by mouth and ibuprofen and encouraged continuing to use the over-the-counter medications.  I gave my usual customary return precautions.  I think it is very unlikely that he was bitten by a black widow or brown recluse, but regardless, I explained to him that the care is symptomatic only.  At his request I gave him a work note so that he can stay home for his insect bite today since he said that it will make it difficult for him to sleep.  He knows to come back if his symptoms get worse.          ____________________________________________  FINAL CLINICAL IMPRESSION(S) / ED DIAGNOSES  Final diagnoses:  Insect bite of lower back, initial encounter     MEDICATIONS GIVEN DURING THIS  VISIT:  Medications  ibuprofen (ADVIL) tablet 600 mg (600 mg Oral Given 12/13/18 0357)  diphenhydrAMINE (BENADRYL) capsule 50 mg (50 mg Oral Given 12/13/18 0357)  Tdap (BOOSTRIX) injection 0.5 mL (0.5 mLs Intramuscular Given 12/13/18 0358)     ED Discharge Orders    None      *Please note:  Jeffrey BurrsJacob Speelman was evaluated in Emergency Department on 12/13/2018 for the symptoms described in the history of present illness. He was evaluated in the context of the global COVID-19 pandemic, which necessitated consideration that the patient might be at risk for infection with the SARS-CoV-2 virus that causes COVID-19. Institutional protocols and algorithms that pertain to the evaluation of patients at risk for COVID-19 are in a state of rapid change based on information released by regulatory bodies including the CDC and federal and state organizations. These policies and algorithms were followed during the patient's care in the ED.  Some ED evaluations and interventions may be delayed as a result of limited staffing during the pandemic.*  Note:  This document was prepared using Dragon voice recognition software and may include unintentional dictation errors.   Loleta RoseForbach, Divon Krabill, MD 12/13/18 [phone]

## 2018-12-13 NOTE — ED Notes (Signed)
Pt states was bitten by a spider. Pt's girlfriend states "it was a black widow, i've been bitten by them a bunch". Pt without any neurological symptoms, no nausea or vomiting. Pt with small red sized area noted to posterior left back.

## 2019-05-28 ENCOUNTER — Emergency Department: Payer: 59

## 2019-05-28 ENCOUNTER — Other Ambulatory Visit: Payer: Self-pay

## 2019-05-28 ENCOUNTER — Emergency Department
Admission: EM | Admit: 2019-05-28 | Discharge: 2019-05-28 | Disposition: A | Discharge status: Home / Self Care | Payer: 59 | Attending: Student in an Organized Health Care Education/Training Program | Admitting: Student in an Organized Health Care Education/Training Program

## 2019-05-28 DIAGNOSIS — N3000 Acute cystitis without hematuria: Secondary | ICD-10-CM | POA: Diagnosis not present

## 2019-05-28 DIAGNOSIS — Z79899 Other long term (current) drug therapy: Secondary | ICD-10-CM | POA: Insufficient documentation

## 2019-05-28 DIAGNOSIS — R197 Diarrhea, unspecified: Secondary | ICD-10-CM | POA: Insufficient documentation

## 2019-05-28 DIAGNOSIS — J45909 Unspecified asthma, uncomplicated: Secondary | ICD-10-CM | POA: Insufficient documentation

## 2019-05-28 DIAGNOSIS — R109 Unspecified abdominal pain: Secondary | ICD-10-CM

## 2019-05-28 DIAGNOSIS — F17290 Nicotine dependence, other tobacco product, uncomplicated: Secondary | ICD-10-CM | POA: Insufficient documentation

## 2019-05-28 DIAGNOSIS — R1011 Right upper quadrant pain: Secondary | ICD-10-CM

## 2019-05-28 LAB — URINALYSIS, COMPLETE (UACMP) WITH MICROSCOPIC
Bilirubin Urine: NEGATIVE
Glucose, UA: NEGATIVE mg/dL
Hgb urine dipstick: NEGATIVE
Ketones, ur: NEGATIVE mg/dL
Nitrite: NEGATIVE
Protein, ur: NEGATIVE mg/dL
Specific Gravity, Urine: 1.02 (ref 1.005–1.030)
WBC, UA: >50 WBC/hpf — ABNORMAL HIGH (ref 0–5)
pH: 5 (ref 5.0–8.0)

## 2019-05-28 LAB — COMPREHENSIVE METABOLIC PANEL
ALT: 34 U/L (ref 0–44)
AST: 21 U/L (ref 15–41)
Albumin: 4.5 g/dL (ref 3.5–5.0)
Alkaline Phosphatase: 50 U/L (ref 38–126)
Anion gap: 10 (ref 5–15)
BUN: 15 mg/dL (ref 6–20)
CO2: 22 mmol/L (ref 22–32)
Calcium: 9.2 mg/dL (ref 8.9–10.3)
Chloride: 104 mmol/L (ref 98–111)
Creatinine, Ser: 0.81 mg/dL (ref 0.61–1.24)
GFR calc Af Amer: >60 mL/min (ref >60)
GFR calc non Af Amer: >60 mL/min (ref >60)
Glucose, Bld: 109 mg/dL — ABNORMAL HIGH (ref 70–99)
Potassium: 4.2 mmol/L (ref 3.5–5.1)
Sodium: 136 mmol/L (ref 135–145)
Total Bilirubin: 0.9 mg/dL (ref 0.3–1.2)
Total Protein: 7.6 g/dL (ref 6.5–8.1)

## 2019-05-28 LAB — CBC
HCT: 45.4 % (ref 39.0–52.0)
Hemoglobin: 14.9 g/dL (ref 13.0–17.0)
MCH: 27.3 pg (ref 26.0–34.0)
MCHC: 32.8 g/dL (ref 30.0–36.0)
MCV: 83.2 fL (ref 80.0–100.0)
Platelets: 268 10*3/uL (ref 150–400)
RBC: 5.46 MIL/uL (ref 4.22–5.81)
RDW: 13 % (ref 11.5–15.5)
WBC: 6.6 10*3/uL (ref 4.0–10.5)
nRBC: 0 % (ref 0.0–0.2)

## 2019-05-28 LAB — LIPASE, BLOOD: Lipase: 23 U/L (ref 11–51)

## 2019-05-28 MED ORDER — SULFAMETHOXAZOLE-TRIMETHOPRIM 800-160 MG PO TABS: 1.0000 | ORAL_TABLET | Freq: Two times a day (BID) | ORAL | 0 refills | Status: DC

## 2019-05-28 MED ORDER — DICYCLOMINE HCL 10 MG PO CAPS: 10.0000 mg | ORAL_CAPSULE | Freq: Three times a day (TID) | ORAL | 0 refills | Status: DC

## 2019-05-28 MED ORDER — FAMOTIDINE IN NACL 20-0.9 MG/50ML-% IV SOLN
20.0000 mg | Freq: Once | INTRAVENOUS | Status: AC
  Administered 2019-05-28: 20 mg via INTRAVENOUS
  Filled 2019-05-28: qty 50

## 2019-05-28 MED ORDER — SODIUM CHLORIDE 0.9% FLUSH: 3.0000 mL | Freq: Once | INTRAVENOUS | Status: DC

## 2019-05-28 MED ORDER — ONDANSETRON 4 MG PO TBDP: 4.0000 mg | ORAL_TABLET | Freq: Three times a day (TID) | ORAL | 0 refills | Status: DC | PRN

## 2019-05-28 MED ORDER — ONDANSETRON HCL 4 MG/2ML IJ SOLN
4.0000 mg | Freq: Once | INTRAMUSCULAR | Status: AC
  Administered 2019-05-28: 4 mg via INTRAVENOUS
  Filled 2019-05-28: qty 2

## 2019-05-28 MED ORDER — SODIUM CHLORIDE 0.9 % IV BOLUS
1000.0000 mL | Freq: Once | INTRAVENOUS | Status: AC
  Administered 2019-05-28: 1000 mL via INTRAVENOUS

## 2019-05-28 MED ORDER — SULFAMETHOXAZOLE-TRIMETHOPRIM 800-160 MG PO TABS
1.0000 | ORAL_TABLET | Freq: Once | ORAL | Status: AC
  Administered 2019-05-28: 17:00:00 1 via ORAL
  Filled 2019-05-28: qty 1

## 2019-05-28 NOTE — Discharge Instructions (Signed)
Your lab work and ultrasound are reassuring.  Your urine shows that you have a urinary tract infection.  Please start Bactrim for infection.  You can take Zofran to help with any nausea.  You can take Bentyl for abdominal pain.  I given you list of fluids to help with your diarrhea.  If symptoms are worsening, you may return to the emergency department.  Please call primary care on Monday for a follow-up appointment early next week.

## 2019-05-28 NOTE — ED Triage Notes (Signed)
Pt c/o abd pain with N/V/D and feeling like he was going to pass out last night while at work, denies any recent exposure to covid. Pt is in NAD, ambulatory to triage with a steady gait.,

## 2019-05-28 NOTE — ED Provider Notes (Signed)
[facility] Emergency Department Provider Note  ____________________________________________  Time seen: Approximately 1:41 PM  I have reviewed the triage vital signs and the nursing notes.   HISTORY  Chief Complaint Abdominal Pain    HPI Jeffrey Joseph is a 24 y.o. male that presents to the emergency department for evaluation of RUQ pain, 2 episodes of vomiting and 5 episodes of diarrhea since yesterday.  Patient states that this has happened previously and resolved on its own. Abdominal discomfort is primarily in the RUQ but moves around to the LUQ occasionally. Stools are watery. No blood and mucus in stools. Patient had one episode of vomiting last night and one this morning. No contacts with Covid 19. No urinary symptoms. No concern for STD or exposure to STD. He has not eaten anything out of the ordinary. No fevers.  Past Medical History:  Diagnosis Date  . Anxiety   . Asthma     Patient Active Problem List   Diagnosis Date Noted  . Empyema ([place])   . Community acquired pneumonia 12/13/2015  . Sepsis ([place]) 12/13/2015    Past Surgical History:  Procedure Laterality Date  . NO PAST SURGERIES      Prior to Admission medications   Medication Sig Start Date End Date Taking? Authorizing Provider  albuterol (PROVENTIL HFA;VENTOLIN HFA) 108 (90 Base) MCG/ACT inhaler Inhale 2 puffs into the lungs every 6 (six) hours as needed for wheezing or shortness of breath. 09/29/17   Schuyler Amor, MD  cefUROXime (CEFTIN) 500 MG tablet Take 1 tablet (500 mg total) by mouth 2 (two) times daily with a meal. 12/21/15   Vaughan Basta, MD  dicyclomine (BENTYL) 10 MG capsule Take 1 capsule (10 mg total) by mouth 4 (four) times daily -  before meals and at bedtime. 05/28/19   Laban Emperor, PA-C  naproxen (NAPROSYN) 500 MG tablet Take 1 tablet (500 mg total) by mouth 2 (two) times daily with a meal. 01/01/18   Sable Feil, PA-C  ondansetron (ZOFRAN ODT) 4 MG  disintegrating tablet Take 1 tablet (4 mg total) by mouth every 8 (eight) hours as needed for nausea or vomiting. 05/28/19   Laban Emperor, PA-C  oxyCODONE-acetaminophen (ROXICET) 5-325 MG tablet Take 1 tablet by mouth every 6 (six) hours as needed for moderate pain or severe pain. 12/21/15   Vaughan Basta, MD  polyethylene glycol (MIRALAX / GLYCOLAX) packet Take 17 g by mouth daily as needed for mild constipation or moderate constipation. 12/21/15   Vaughan Basta, MD  sulfamethoxazole-trimethoprim (BACTRIM DS) 800-160 MG tablet Take 1 tablet by mouth 2 (two) times daily. 05/28/19   Laban Emperor, PA-C  traMADol (ULTRAM) 50 MG tablet Take 1 tablet (50 mg total) by mouth every 12 (twelve) hours as needed. 01/01/18   Sable Feil, PA-C    Allergies Patient has no known allergies.  Family History  Problem Relation Age of Onset  . Diabetes Mother   . Kidney failure Mother   . Diabetes Father   . Diabetes Paternal Grandfather     Social History Social History   Tobacco Use  . Smoking status: Current Every Day Smoker    Types: Cigars, E-cigarettes  . Smokeless tobacco: Current User  Substance Use Topics  . Alcohol use: Yes    Comment: occ  . Drug use: No     Review of Systems  Constitutional: No fever/chills ENT: No upper respiratory complaints. Cardiovascular: No chest pain. Respiratory: No cough. No SOB. Gastrointestinal: Positive for abdominal  pain.  Positive for nausea and vomiting x2. Positive for diarrhea. Musculoskeletal: Negative for musculoskeletal pain. Skin: Negative for rash, abrasions, lacerations, ecchymosis. Neurological: Negative for headaches, numbness or tingling   ____________________________________________   PHYSICAL EXAM:  VITAL SIGNS: ED Triage Vitals [05/28/19 1157]  Enc Vitals Group     BP 125/83     Pulse Rate 68     Resp 18     Temp 97.9 F (36.6 C)     Temp Source Oral     SpO2 100 %     Weight 250 lb (113.4 kg)      Height 6\' 4"  (1.93 m)     Head Circumference      Peak Flow      Pain Score 8     Pain Loc      Pain Edu?      Excl. in [place]?      Constitutional: Alert and oriented. Well appearing and in no acute distress. Eyes: Conjunctivae are normal. PERRL. EOMI. Head: Atraumatic. ENT:      Ears:      Nose: No congestion/rhinnorhea.      Mouth/Throat: Mucous membranes are moist.  Neck: No stridor.   Cardiovascular: Normal rate, regular rhythm.  Good peripheral circulation. Respiratory: Normal respiratory effort without tachypnea or retractions. Lungs CTAB. Good air entry to the bases with no decreased or absent breath sounds. Gastrointestinal: Bowel sounds 4 quadrants. Soft and nontender to palpation. No guarding or rigidity. No palpable masses. No distention. No CVA tenderness. Musculoskeletal: Full range of motion to all extremities. No gross deformities appreciated. Neurologic:  Normal speech and language. No gross focal neurologic deficits are appreciated.  Skin:  Skin is warm, dry and intact. No rash noted. Psychiatric: Mood and affect are normal. Speech and behavior are normal. Patient exhibits appropriate insight and judgement.   ____________________________________________   LABS (all labs ordered are listed, but only abnormal results are displayed)  Labs Reviewed  COMPREHENSIVE METABOLIC PANEL - Abnormal; Notable for the following components:      Result Value   Glucose, Bld 109 (*)    All other components within normal limits  URINALYSIS, COMPLETE (UACMP) WITH MICROSCOPIC - Abnormal; Notable for the following components:   Color, Urine YELLOW (*)    APPearance HAZY (*)    Leukocytes,Ua LARGE (*)    WBC, UA >50 (*)    Bacteria, UA RARE (*)    All other components within normal limits  LIPASE, BLOOD  CBC   ____________________________________________  EKG   ____________________________________________  RADIOLOGY   Abdomen Limited RUQ  Result Date:  05/28/2019 CLINICAL DATA:  Acute right upper quadrant abdominal pain. EXAM: ULTRASOUND ABDOMEN LIMITED RIGHT UPPER QUADRANT COMPARISON:  July 31, 2018. FINDINGS: Gallbladder: No gallstones or wall thickening visualized. No sonographic Murphy sign noted by sonographer. Common bile duct: Diameter: 3 mm which is within normal limits. Liver: No focal lesion identified. Within normal limits in parenchymal echogenicity. Portal vein is patent on color Doppler imaging with normal direction of blood flow towards the liver. Other: None. IMPRESSION: No abnormality seen in the right upper quadrant of the abdomen. Electronically Signed   By: August 02, 2018 M.D.   On: 05/28/2019 14:49    ____________________________________________    PROCEDURES  Procedure(s) performed:    Procedures    Medications  sodium chloride flush (NS) 0.9 % injection 3 mL (3 mLs Intravenous Not Given 05/28/19 1311)  sodium chloride 0.9 % bolus 1,000 mL (0 mLs Intravenous Stopped  05/28/19 1625)  ondansetron (ZOFRAN) injection 4 mg (4 mg Intravenous Given 05/28/19 1423)  famotidine (PEPCID) IVPB 20 mg premix (0 mg Intravenous Stopped 05/28/19 1453)  sulfamethoxazole-trimethoprim (BACTRIM DS) 800-160 MG per tablet 1 tablet (1 tablet Oral Given 05/28/19 1630)     ____________________________________________   INITIAL IMPRESSION / ASSESSMENT AND PLAN / ED COURSE  Pertinent labs & imaging results that were available during my care of the patient were reviewed by me and considered in my medical decision making (see chart for details).  Review of the [place] CSRS was performed in accordance of the [facility] prior to dispensing any controlled drugs.  Differential diagnosis includes, but is not limited to, biliary disease (biliary colic, acute cholecystitis, cholangitis, choledocholithiasis, etc), intrathoracic causes for epigastric abdominal pain including ACS, gastritis, duodenitis, pancreatitis, small bowel or large bowel obstruction,  abdominal aortic aneurysm, hernia, and ulcer(s).  Patient's diagnosis is consistent with viral gastrointestinal illness and UTI. Bloodwork largely within normal limits.  Right upper quadrant ultrasound negative for acute abnormalities.  Urinalysis shows leukocytes, RBC, WBC, and rare bacteria. Urine will be sent for culture. Symptoms not consistent with nephrolithiasis. Patient declines STD testing. He is unable to give [place] a stool sample in the ED, as he has not had any diarrhea. Symptoms improved with zofran and pepcid. Patient will be discharged home with prescriptions for Bactrim, zofran and bentyl. Patient declines Covid testing. Patient is to follow up with PCP as directed. Patient is given ED precautions to return to the ED for any worsening or new symptoms.  Jeffrey Joseph was evaluated in Emergency Department on 05/28/2019 for the symptoms described in the history of present illness. He was evaluated in the context of the global COVID-19 pandemic, which necessitated consideration that the patient might be at risk for infection with the SARS-CoV-2 virus that causes COVID-19. Institutional protocols and algorithms that pertain to the evaluation of patients at risk for COVID-19 are in a state of rapid change based on information released by regulatory bodies including the CDC and federal and state organizations. These policies and algorithms were followed during the patient's care in the ED.   ____________________________________________  FINAL CLINICAL IMPRESSION(S) / ED DIAGNOSES  Final diagnoses:  Abdominal pain  Right upper quadrant abdominal pain  Diarrhea, unspecified type  Acute cystitis without hematuria      NEW MEDICATIONS STARTED DURING THIS VISIT:  ED Discharge Orders         Ordered    dicyclomine (BENTYL) 10 MG capsule  3 times daily before meals & bedtime     05/28/19 1615    ondansetron (ZOFRAN ODT) 4 MG disintegrating tablet  Every 8 hours PRN     05/28/19 1615     sulfamethoxazole-trimethoprim (BACTRIM DS) 800-160 MG tablet  2 times daily     05/28/19 1616              This chart was dictated using voice recognition software/Dragon. Despite best efforts to proofread, errors can occur which can change the meaning. Any change was purely unintentional.    Enid Derry, PA-C 05/28/19 1945    Willy Eddy, MD 05/29/19 [phone]

## 2019-05-30 LAB — URINE CULTURE: Culture: NO GROWTH

## 2019-05-31 ENCOUNTER — Ambulatory Visit
Admission: EM | Admit: 2019-05-31 | Discharge: 2019-05-31 | Disposition: A | Discharge status: Home / Self Care | Payer: 59 | Attending: Emergency Medicine | Admitting: Emergency Medicine

## 2019-05-31 ENCOUNTER — Encounter: Payer: Self-pay | Admitting: Emergency Medicine

## 2019-05-31 ENCOUNTER — Other Ambulatory Visit: Payer: Self-pay

## 2019-05-31 DIAGNOSIS — R369 Urethral discharge, unspecified: Secondary | ICD-10-CM

## 2019-05-31 DIAGNOSIS — Z202 Contact with and (suspected) exposure to infections with a predominantly sexual mode of transmission: Secondary | ICD-10-CM

## 2019-05-31 MED ORDER — DOXYCYCLINE HYCLATE 100 MG PO CAPS: 100.0000 mg | ORAL_CAPSULE | Freq: Two times a day (BID) | ORAL | 0 refills | Status: AC

## 2019-05-31 MED ORDER — CEFTRIAXONE SODIUM 500 MG IJ SOLR
500.0000 mg | Freq: Once | INTRAMUSCULAR | Status: AC
  Administered 2019-05-31: 500 mg via INTRAMUSCULAR

## 2019-05-31 NOTE — Discharge Instructions (Addendum)
You were treated with an antibiotic today called Rocephin.  Additionally, take doxycycline twice a day for 7 days.    Do not have sex for 7 days.  Your STD tests are pending.  If your test results are positive, we will call you.  You may need additional treatment and your partner(s) may also need treatment.

## 2019-05-31 NOTE — ED Provider Notes (Addendum)
Roderic Palau    CSN: [id number] Arrival date & time: 05/31/19  1150      History   Chief Complaint Chief Complaint  Patient presents with  . Penile Discharge    HPI Jeffrey Joseph is a 24 y.o. male.   Patient presents with yellow penile discharge for > 1 year.  He states he was tested for STDs and treated a year ago at another urgent care but his symptoms never cleared up.  He denies fever, chills, rash, lesions, abdominal pain, dysuria, back pain, testicular pain, or other symptoms.  The history is provided by the patient.    Past Medical History:  Diagnosis Date  . Anxiety   . Asthma     Patient Active Problem List   Diagnosis Date Noted  . Empyema ([place])   . Community acquired pneumonia 12/13/2015  . Sepsis ([place]) 12/13/2015    Past Surgical History:  Procedure Laterality Date  . NO PAST SURGERIES         Home Medications    Prior to Admission medications   Medication Sig Start Date End Date Taking? Authorizing Provider  dicyclomine (BENTYL) 10 MG capsule Take 1 capsule (10 mg total) by mouth 4 (four) times daily -  before meals and at bedtime. 05/28/19  Yes Laban Emperor, PA-C  sulfamethoxazole-trimethoprim (BACTRIM DS) 800-160 MG tablet Take 1 tablet by mouth 2 (two) times daily. 05/28/19  Yes Laban Emperor, PA-C  albuterol (PROVENTIL HFA;VENTOLIN HFA) 108 (90 Base) MCG/ACT inhaler Inhale 2 puffs into the lungs every 6 (six) hours as needed for wheezing or shortness of breath. 09/29/17   Schuyler Amor, MD  cefUROXime (CEFTIN) 500 MG tablet Take 1 tablet (500 mg total) by mouth 2 (two) times daily with a meal. 12/21/15   Vaughan Basta, MD  doxycycline (VIBRAMYCIN) 100 MG capsule Take 1 capsule (100 mg total) by mouth 2 (two) times daily for 7 days. 05/31/19 06/07/19  Sharion Balloon, NP  naproxen (NAPROSYN) 500 MG tablet Take 1 tablet (500 mg total) by mouth 2 (two) times daily with a meal. 01/01/18   Sable Feil, PA-C  ondansetron (ZOFRAN ODT) 4  MG disintegrating tablet Take 1 tablet (4 mg total) by mouth every 8 (eight) hours as needed for nausea or vomiting. 05/28/19   Laban Emperor, PA-C  oxyCODONE-acetaminophen (ROXICET) 5-325 MG tablet Take 1 tablet by mouth every 6 (six) hours as needed for moderate pain or severe pain. 12/21/15   Vaughan Basta, MD  polyethylene glycol (MIRALAX / GLYCOLAX) packet Take 17 g by mouth daily as needed for mild constipation or moderate constipation. 12/21/15   Vaughan Basta, MD  traMADol (ULTRAM) 50 MG tablet Take 1 tablet (50 mg total) by mouth every 12 (twelve) hours as needed. 01/01/18   Sable Feil, PA-C    Family History Family History  Problem Relation Age of Onset  . Diabetes Mother   . Kidney failure Mother   . Diabetes Father   . Diabetes Paternal Grandfather     Social History Social History   Tobacco Use  . Smoking status: Current Every Day Smoker    Types: Cigars, E-cigarettes  . Smokeless tobacco: Former Network engineer Use Topics  . Alcohol use: Yes    Comment: occ  . Drug use: No     Allergies   Patient has no known allergies.   Review of Systems Review of Systems  Constitutional: Negative for chills and fever.  HENT: Negative for ear pain and  sore throat.   Eyes: Negative for pain and visual disturbance.  Respiratory: Negative for cough and shortness of breath.   Cardiovascular: Negative for chest pain and palpitations.  Gastrointestinal: Negative for abdominal pain and vomiting.  Genitourinary: Positive for discharge. Negative for dysuria, flank pain, hematuria and testicular pain.  Musculoskeletal: Negative for arthralgias and back pain.  Skin: Negative for color change and rash.  Neurological: Negative for seizures and syncope.  All other systems reviewed and are negative.    Physical Exam Triage Vital Signs ED Triage Vitals  Enc Vitals Group     BP      Pulse      Resp      Temp      Temp src      SpO2      Weight      Height       Head Circumference      Peak Flow      Pain Score      Pain Loc      Pain Edu?      Excl. in GC?    No data found.  Updated Vital Signs BP 109/70 (BP Location: Right Arm)   Pulse 62   Temp 97.8 F (36.6 C) (Oral)   Resp 18   Ht 6\' 4"  (1.93 m)   Wt 250 lb (113.4 kg)   SpO2 97%   BMI 30.43 kg/m   Visual Acuity Right Eye Distance:   Left Eye Distance:   Bilateral Distance:    Right Eye Near:   Left Eye Near:    Bilateral Near:     Physical Exam Vitals and nursing note reviewed.  Constitutional:      General: He is not in acute distress.    Appearance: He is well-developed. He is not ill-appearing.  HENT:     Head: Normocephalic and atraumatic.     Mouth/Throat:     Mouth: Mucous membranes are moist.     Pharynx: Oropharynx is clear.  Eyes:     Conjunctiva/sclera: Conjunctivae normal.  Cardiovascular:     Rate and Rhythm: Normal rate and regular rhythm.     Heart sounds: No murmur.  Pulmonary:     Effort: Pulmonary effort is normal. No respiratory distress.     Breath sounds: Normal breath sounds.  Abdominal:     General: Bowel sounds are normal.     Palpations: Abdomen is soft.     Tenderness: There is no abdominal tenderness. There is no right CVA tenderness, left CVA tenderness, guarding or rebound.  Genitourinary:    Penis: Normal.      Testes: Normal.  Musculoskeletal:     Cervical back: Neck supple.  Skin:    General: Skin is warm and dry.     Findings: No rash.  Neurological:     General: No focal deficit present.     Mental Status: He is alert and oriented to person, place, and time.  Psychiatric:        Mood and Affect: Mood normal.        Behavior: Behavior normal.      UC Treatments / Results  Labs (all labs ordered are listed, but only abnormal results are displayed) Labs Reviewed  CYTOLOGY, (ORAL, ANAL, URETHRAL) ANCILLARY ONLY    EKG   Radiology No results found.  Procedures Procedures (including critical care time)   Medications Ordered in UC Medications  cefTRIAXone (ROCEPHIN) injection 500 mg (500 mg Intramuscular Given 05/31/19 1243)  Initial Impression / Assessment and Plan / UC Course  I have reviewed the triage vital signs and the nursing notes.  Pertinent labs & imaging results that were available during my care of the patient were reviewed by me and considered in my medical decision making (see chart for details).    Penile discharge, exposure to STD.  Urethral swab obtained.  Treated today with Rocephin and doxycycline.  Instructed patient to refrain from sexual activity x7 days.  Discussed with him that if his STD test are positive requiring additional treatment, we will call him.  Discussed that he and his sexual partner may require additional treatment at that time.  Patient agrees to plan of care.     Final Clinical Impressions(s) / UC Diagnoses   Final diagnoses:  Penile discharge  Exposure to STD     Discharge Instructions     You were treated with an antibiotic today called Rocephin.  Additionally, take doxycycline twice a day for 7 days.    Do not have sex for 7 days.  Your STD tests are pending.  If your test results are positive, we will call you.  You may need additional treatment and your partner(s) may also need treatment.         ED Prescriptions    Medication Sig Dispense Auth. Provider   doxycycline (VIBRAMYCIN) 100 MG capsule Take 1 capsule (100 mg total) by mouth 2 (two) times daily for 7 days. 14 capsule Mickie Bail, NP     PDMP not reviewed this encounter.   Mickie Bail, NP 05/31/19 1245    Mickie Bail, NP 05/31/19 1246

## 2019-05-31 NOTE — ED Triage Notes (Signed)
Pt c/o yellow penile discharge. Started about a year ago. He states he was treated but never cleared up.

## 2019-06-01 LAB — CYTOLOGY, (ORAL, ANAL, URETHRAL) ANCILLARY ONLY
Chlamydia: NEGATIVE
Neisseria Gonorrhea: NEGATIVE
Trichomonas: POSITIVE — AB

## 2019-06-02 ENCOUNTER — Telehealth (HOSPITAL_COMMUNITY): Payer: Self-pay | Admitting: Emergency Medicine

## 2019-06-02 MED ORDER — METRONIDAZOLE 500 MG PO TABS: 2000.0000 mg | ORAL_TABLET | Freq: Once | ORAL | 0 refills | Status: AC

## 2019-06-02 NOTE — Telephone Encounter (Signed)
Trichomonas is positive. Rx  for Flagyl 2 grams, once was sent to the pharmacy of record. Pt needs education to refrain from sexual intercourse for 7 days to give the medicine time to work. Sexual partners need to be notified and tested/treated. Condoms may reduce risk of reinfection. Recheck for further evaluation if symptoms are not improving.   Patient contacted by phone and made aware of    results. Pt verbalized understanding and had all questions answered.    

## 2019-06-21 ENCOUNTER — Ambulatory Visit
Admission: EM | Admit: 2019-06-21 | Discharge: 2019-06-21 | Disposition: A | Discharge status: Home / Self Care | Payer: 59 | Attending: Emergency Medicine | Admitting: Emergency Medicine

## 2019-06-21 ENCOUNTER — Other Ambulatory Visit: Payer: Self-pay

## 2019-06-21 ENCOUNTER — Encounter: Payer: Self-pay | Admitting: Emergency Medicine

## 2019-06-21 DIAGNOSIS — Z113 Encounter for screening for infections with a predominantly sexual mode of transmission: Secondary | ICD-10-CM | POA: Diagnosis present

## 2019-06-21 NOTE — Discharge Instructions (Addendum)
Your STD tests are pending.  If your test results are positive, we will call you.  You may need treatment and your partner(s) may also need treatment.  Do not have sex until your test results are back.

## 2019-06-21 NOTE — ED Provider Notes (Signed)
Jeffrey Joseph    CSN: [id number] Arrival date & time: 06/21/19  1543      History   Chief Complaint Chief Complaint  Patient presents with  . STD check    HPI Jeffrey Joseph is a 24 y.o. male.   Patient presents with request for STD testing.  He was seen here on 05/31/2019; tested positive for trichomonas and was treated.  He denies current symptoms, including penile discharge, testicular pain, abdominal pain, dysuria, back pain, rash, lesions, or other symptoms.  The history is provided by the patient.    Past Medical History:  Diagnosis Date  . Anxiety   . Asthma     Patient Active Problem List   Diagnosis Date Noted  . Empyema ([place])   . Community acquired pneumonia 12/13/2015  . Sepsis ([place]) 12/13/2015    Past Surgical History:  Procedure Laterality Date  . NO PAST SURGERIES         Home Medications    Prior to Admission medications   Medication Sig Start Date End Date Taking? Authorizing Provider  albuterol (PROVENTIL HFA;VENTOLIN HFA) 108 (90 Base) MCG/ACT inhaler Inhale 2 puffs into the lungs every 6 (six) hours as needed for wheezing or shortness of breath. 09/29/17   Schuyler Amor, MD  cefUROXime (CEFTIN) 500 MG tablet Take 1 tablet (500 mg total) by mouth 2 (two) times daily with a meal. 12/21/15   Vaughan Basta, MD  dicyclomine (BENTYL) 10 MG capsule Take 1 capsule (10 mg total) by mouth 4 (four) times daily -  before meals and at bedtime. 05/28/19   Laban Emperor, PA-C  naproxen (NAPROSYN) 500 MG tablet Take 1 tablet (500 mg total) by mouth 2 (two) times daily with a meal. 01/01/18   Sable Feil, PA-C  ondansetron (ZOFRAN ODT) 4 MG disintegrating tablet Take 1 tablet (4 mg total) by mouth every 8 (eight) hours as needed for nausea or vomiting. 05/28/19   Laban Emperor, PA-C  oxyCODONE-acetaminophen (ROXICET) 5-325 MG tablet Take 1 tablet by mouth every 6 (six) hours as needed for moderate pain or severe pain. 12/21/15   Vaughan Basta, MD  polyethylene glycol (MIRALAX / GLYCOLAX) packet Take 17 g by mouth daily as needed for mild constipation or moderate constipation. 12/21/15   Vaughan Basta, MD  sulfamethoxazole-trimethoprim (BACTRIM DS) 800-160 MG tablet Take 1 tablet by mouth 2 (two) times daily. 05/28/19   Laban Emperor, PA-C  traMADol (ULTRAM) 50 MG tablet Take 1 tablet (50 mg total) by mouth every 12 (twelve) hours as needed. 01/01/18   Sable Feil, PA-C    Family History Family History  Problem Relation Age of Onset  . Diabetes Mother   . Kidney failure Mother   . Diabetes Father   . Diabetes Paternal Grandfather     Social History Social History   Tobacco Use  . Smoking status: Current Every Day Smoker    Packs/day: 0.25    Types: Cigarettes  . Smokeless tobacco: Former Systems developer  . Tobacco comment: 3 cig/day  Substance Use Topics  . Alcohol use: Yes    Comment: occ  . Drug use: No     Allergies   Patient has no known allergies.   Review of Systems Review of Systems  Constitutional: Negative for chills and fever.  HENT: Negative for ear pain and sore throat.   Eyes: Negative for pain and visual disturbance.  Respiratory: Negative for cough and shortness of breath.   Cardiovascular: Negative for chest pain  and palpitations.  Gastrointestinal: Negative for abdominal pain and vomiting.  Genitourinary: Negative for discharge, dysuria, flank pain, hematuria and testicular pain.  Musculoskeletal: Negative for arthralgias and back pain.  Skin: Negative for color change and rash.  Neurological: Negative for seizures and syncope.  All other systems reviewed and are negative.    Physical Exam Triage Vital Signs ED Triage Vitals  Enc Vitals Group     BP      Pulse      Resp      Temp      Temp src      SpO2      Weight      Height      Head Circumference      Peak Flow      Pain Score      Pain Loc      Pain Edu?      Excl. in GC?    No data found.  Updated  Vital Signs BP 104/69 (BP Location: Left Arm)   Pulse 82   Temp 98.8 F (37.1 C) (Oral)   Resp 18   Ht 6\' 4"  (1.93 m)   Wt 250 lb (113.4 kg)   SpO2 98%   BMI 30.43 kg/m   Visual Acuity Right Eye Distance:   Left Eye Distance:   Bilateral Distance:    Right Eye Near:   Left Eye Near:    Bilateral Near:     Physical Exam Vitals and nursing note reviewed.  Constitutional:      Appearance: He is well-developed.  HENT:     Head: Normocephalic and atraumatic.     Mouth/Throat:     Mouth: Mucous membranes are moist.  Eyes:     Conjunctiva/sclera: Conjunctivae normal.  Cardiovascular:     Rate and Rhythm: Normal rate and regular rhythm.     Heart sounds: No murmur.  Pulmonary:     Effort: Pulmonary effort is normal. No respiratory distress.     Breath sounds: Normal breath sounds.  Abdominal:     General: Bowel sounds are normal.     Palpations: Abdomen is soft.     Tenderness: There is no abdominal tenderness. There is no right CVA tenderness, left CVA tenderness, guarding or rebound.  Genitourinary:    Penis: Normal.      Testes: Normal.  Musculoskeletal:     Cervical back: Neck supple.  Skin:    General: Skin is warm and dry.  Neurological:     General: No focal deficit present.     Mental Status: He is alert and oriented to person, place, and time.  Psychiatric:        Mood and Affect: Mood normal.        Behavior: Behavior normal.      UC Treatments / Results  Labs (all labs ordered are listed, but only abnormal results are displayed) Labs Reviewed  CYTOLOGY, (ORAL, ANAL, URETHRAL) ANCILLARY ONLY    EKG   Radiology No results found.  Procedures Procedures (including critical care time)  Medications Ordered in UC Medications - No data to display  Initial Impression / Assessment and Plan / UC Course  I have reviewed the triage vital signs and the nursing notes.  Pertinent labs & imaging results that were available during my care of the  patient were reviewed by me and considered in my medical decision making (see chart for details).    Screening for STD.  Urethral swab obtained.  Instructed patient to  refrain from sexual activity until his test result back; Discussed that we will call him if they are positive.  Discussed that he and his partner may require treatment at that time.  Patient agrees to plan of care.     Final Clinical Impressions(s) / UC Diagnoses   Final diagnoses:  Screening examination for STD (sexually transmitted disease)     Discharge Instructions     Your STD tests are pending.  If your test results are positive, we will call you.  You may need treatment and your partner(s) may also need treatment.  Do not have sex until your test results are back.         ED Prescriptions    None     PDMP not reviewed this encounter.   Mickie Bail, NP 06/21/19 [phone]

## 2019-06-21 NOTE — ED Triage Notes (Signed)
Patient in today for recheck of STDs. Patient was seen and treated for same on 05/31/19. Patient denies any symptoms.

## 2019-06-23 LAB — CYTOLOGY, (ORAL, ANAL, URETHRAL) ANCILLARY ONLY
Chlamydia: NEGATIVE
Neisseria Gonorrhea: NEGATIVE
Trichomonas: NEGATIVE

## 2019-11-01 ENCOUNTER — Emergency Department
Admission: EM | Admit: 2019-11-01 | Discharge: 2019-11-03 | Disposition: A | Discharge status: Other Acute Inpatient Hospital | Payer: 59 | Attending: Emergency Medicine | Admitting: Emergency Medicine

## 2019-11-01 ENCOUNTER — Other Ambulatory Visit: Payer: Self-pay

## 2019-11-01 DIAGNOSIS — Z20822 Contact with and (suspected) exposure to covid-19: Secondary | ICD-10-CM | POA: Insufficient documentation

## 2019-11-01 DIAGNOSIS — Y9389 Activity, other specified: Secondary | ICD-10-CM | POA: Diagnosis not present

## 2019-11-01 DIAGNOSIS — Y92019 Unspecified place in single-family (private) house as the place of occurrence of the external cause: Secondary | ICD-10-CM | POA: Insufficient documentation

## 2019-11-01 DIAGNOSIS — Z79899 Other long term (current) drug therapy: Secondary | ICD-10-CM | POA: Insufficient documentation

## 2019-11-01 DIAGNOSIS — F339 Major depressive disorder, recurrent, unspecified: Secondary | ICD-10-CM | POA: Diagnosis present

## 2019-11-01 DIAGNOSIS — F17210 Nicotine dependence, cigarettes, uncomplicated: Secondary | ICD-10-CM | POA: Diagnosis not present

## 2019-11-01 DIAGNOSIS — T50902A Poisoning by unspecified drugs, medicaments and biological substances, intentional self-harm, initial encounter: Secondary | ICD-10-CM | POA: Diagnosis not present

## 2019-11-01 DIAGNOSIS — Y999 Unspecified external cause status: Secondary | ICD-10-CM | POA: Diagnosis not present

## 2019-11-01 DIAGNOSIS — T1491XA Suicide attempt, initial encounter: Secondary | ICD-10-CM

## 2019-11-01 DIAGNOSIS — Z046 Encounter for general psychiatric examination, requested by authority: Secondary | ICD-10-CM | POA: Diagnosis present

## 2019-11-01 LAB — COMPREHENSIVE METABOLIC PANEL
ALT: 27 U/L (ref 0–44)
ALT: 26 U/L (ref 0–44)
AST: 19 U/L (ref 15–41)
AST: 28 U/L (ref 15–41)
Albumin: 3.8 g/dL (ref 3.5–5.0)
Albumin: 3.9 g/dL (ref 3.5–5.0)
Alkaline Phosphatase: 45 U/L (ref 38–126)
Alkaline Phosphatase: 44 U/L (ref 38–126)
Anion gap: 6 (ref 5–15)
Anion gap: 3 — ABNORMAL LOW (ref 5–15)
BUN: 7 mg/dL (ref 6–20)
BUN: 6 mg/dL (ref 6–20)
CO2: 26 mmol/L (ref 22–32)
CO2: 30 mmol/L (ref 22–32)
Calcium: 8.8 mg/dL — ABNORMAL LOW (ref 8.9–10.3)
Calcium: 8.9 mg/dL (ref 8.9–10.3)
Chloride: 109 mmol/L (ref 98–111)
Chloride: 110 mmol/L (ref 98–111)
Creatinine, Ser: 0.76 mg/dL (ref 0.61–1.24)
Creatinine, Ser: 0.76 mg/dL (ref 0.61–1.24)
GFR calc Af Amer: >60 mL/min (ref >60)
GFR calc Af Amer: >60 mL/min (ref >60)
GFR calc non Af Amer: >60 mL/min (ref >60)
GFR calc non Af Amer: >60 mL/min (ref >60)
Glucose, Bld: 130 mg/dL — ABNORMAL HIGH (ref 70–99)
Glucose, Bld: 89 mg/dL (ref 70–99)
Potassium: 3.1 mmol/L — ABNORMAL LOW (ref 3.5–5.1)
Potassium: 4.5 mmol/L (ref 3.5–5.1)
Sodium: 141 mmol/L (ref 135–145)
Sodium: 143 mmol/L (ref 135–145)
Total Bilirubin: 1.2 mg/dL (ref 0.3–1.2)
Total Bilirubin: 1.5 mg/dL — ABNORMAL HIGH (ref 0.3–1.2)
Total Protein: 6.5 g/dL (ref 6.5–8.1)
Total Protein: 6.6 g/dL (ref 6.5–8.1)

## 2019-11-01 LAB — CBC WITH DIFFERENTIAL/PLATELET
Abs Immature Granulocytes: 0.03 10*3/uL (ref 0.00–0.07)
Basophils Absolute: 0 10*3/uL (ref 0.0–0.1)
Basophils Relative: 0 %
Eosinophils Absolute: 0.1 10*3/uL (ref 0.0–0.5)
Eosinophils Relative: 1 %
HCT: 40.8 % (ref 39.0–52.0)
Hemoglobin: 13.7 g/dL (ref 13.0–17.0)
Immature Granulocytes: 0 %
Lymphocytes Relative: 13 %
Lymphs Abs: 1 10*3/uL (ref 0.7–4.0)
MCH: 27.7 pg (ref 26.0–34.0)
MCHC: 33.6 g/dL (ref 30.0–36.0)
MCV: 82.4 fL (ref 80.0–100.0)
Monocytes Absolute: 0.3 10*3/uL (ref 0.1–1.0)
Monocytes Relative: 4 %
Neutro Abs: 6.1 10*3/uL (ref 1.7–7.7)
Neutrophils Relative %: 82 %
Platelets: 219 10*3/uL (ref 150–400)
RBC: 4.95 MIL/uL (ref 4.22–5.81)
RDW: 12.8 % (ref 11.5–15.5)
WBC: 7.6 10*3/uL (ref 4.0–10.5)
nRBC: 0 % (ref 0.0–0.2)

## 2019-11-01 LAB — MAGNESIUM
Magnesium: 2 mg/dL (ref 1.7–2.4)
Magnesium: 2.1 mg/dL (ref 1.7–2.4)

## 2019-11-01 LAB — SALICYLATE LEVEL: Salicylate Lvl: <7 mg/dL — ABNORMAL LOW (ref 7.0–30.0)

## 2019-11-01 LAB — LIPASE, BLOOD
Lipase: 20 U/L (ref 11–51)
Lipase: 21 U/L (ref 11–51)

## 2019-11-01 LAB — ETHANOL: Alcohol, Ethyl (B): <10 mg/dL (ref <10)

## 2019-11-01 LAB — ACETAMINOPHEN LEVEL
Acetaminophen (Tylenol), Serum: <10 ug/mL — ABNORMAL LOW (ref 10–30)
Acetaminophen (Tylenol), Serum: <10 ug/mL — ABNORMAL LOW (ref 10–30)

## 2019-11-01 LAB — SARS CORONAVIRUS 2 BY RT PCR (HOSPITAL ORDER, PERFORMED IN ~~LOC~~ HOSPITAL LAB): SARS Coronavirus 2: NEGATIVE

## 2019-11-01 MED ORDER — LACTATED RINGERS IV BOLUS
1000.0000 mL | Freq: Once | INTRAVENOUS | Status: AC
  Administered 2019-11-01: 1000 mL via INTRAVENOUS

## 2019-11-01 MED ORDER — LORAZEPAM 2 MG/ML IJ SOLN: 2.0000 mg | Freq: Once | INTRAMUSCULAR | Status: AC

## 2019-11-01 MED ORDER — LORAZEPAM 2 MG/ML IJ SOLN
INTRAMUSCULAR | Status: AC
  Administered 2019-11-01: 2 mg via INTRAVENOUS
  Filled 2019-11-01: qty 1

## 2019-11-01 MED ORDER — ONDANSETRON HCL 4 MG/2ML IJ SOLN
4.0000 mg | Freq: Once | INTRAMUSCULAR | Status: AC
  Administered 2019-11-01: 4 mg via INTRAVENOUS
  Filled 2019-11-01: qty 2

## 2019-11-01 MED ORDER — SODIUM CHLORIDE 0.9 % IV BOLUS
1000.0000 mL | Freq: Once | INTRAVENOUS | Status: AC
  Administered 2019-11-01: 1000 mL via INTRAVENOUS

## 2019-11-01 MED ORDER — CHARCOAL ACTIVATED PO LIQD
50.0000 g | Freq: Once | ORAL | Status: DC
  Filled 2019-11-01: qty 240

## 2019-11-01 NOTE — ED Notes (Signed)
Jess with poison control contacted.   Recommended oral charcoal if pt would remain alert enough   Monitor for: QTC widened  Tachycardia Seizures - if occur, Benzos first then Barbs.  CMP with magnesium Tylenol 4 hours out (1045 pm)

## 2019-11-01 NOTE — ED Notes (Signed)
Pt very easy to wake and able to talk in complete sentences when woken but is still choosing to sleep.

## 2019-11-01 NOTE — ED Notes (Signed)
QRS duration is currently 88 QTc 459

## 2019-11-01 NOTE — ED Notes (Signed)
QRS duration 93 QTc 422
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
## 2019-11-01 NOTE — ED Triage Notes (Signed)
Pt to ED via EMS from home after taking all of wife's Seroquel prescription. Unsure of an exact amount as wife mixed prescription refill with previous bottle of medication but refill was 30 pills, estimated pt took at least 750mg . Pt is very drowsy upon arrival but responds to painful stimulation. Pt not able to answer questions before falling back to sleep.

## 2019-11-01 NOTE — ED Notes (Signed)
Poison control called and spoke to this RN.  States EMS reported patient taking >30 25mg  seroquel at 1830 tonight, is pale and sleepy.  PC recommends on arrival EKG, IV fluids if tachy or hypotensive, supplemental potassium and magnesium to higher ends of normal if prolonged QTs, 4hr post ingestion cmp, mag, and tylenol levels, and observe 6-8 hours.  PC to follow back up.

## 2019-11-01 NOTE — ED Notes (Signed)
New blankets given. Pt shivering but movement is not seizure like in nature. When asked if he was could pt able to verbalize he was and was able to talk to RN briefly before falling back to sleep.

## 2019-11-01 NOTE — ED Provider Notes (Signed)
[facility] Emergency Department Provider Note  ____________________________________________  Time seen: Approximately 7:58 PM  I have reviewed the triage vital signs and the nursing notes.   HISTORY  Chief Complaint Drug Overdose    Level 5 Caveat: Portions of the History and Physical including HPI and review of systems are unable to be completely obtained due to patient being a poor historian   HPI Jeffrey Joseph is a 24 y.o. male with a history of anxiety and asthma who reportedly got into a argument with his  wife at home, and impulsively ingested a bottle of her Seroquel medication, estimated to be at least 30 pills of 50 mg tablets.  Wife had been combining refills of the medication into an old bottle, so unsure of how many were in the bottle before the ingestion.  Patient denies any pain or nausea.  Feels sleepy.  Denies any coingestions, denies alcohol or drug use.  Ingestion occurred at about 6:30 PM tonight   Past Medical History:  Diagnosis Date  . Anxiety   . Asthma      Patient Active Problem List   Diagnosis Date Noted  . Empyema (HCC)   . Community acquired pneumonia 12/13/2015  . Sepsis (HCC) 12/13/2015     Past Surgical History:  Procedure Laterality Date  . NO PAST SURGERIES       Prior to Admission medications   Medication Sig Start Date End Date Taking? Authorizing Provider  albuterol (PROVENTIL HFA;VENTOLIN HFA) 108 (90 Base) MCG/ACT inhaler Inhale 2 puffs into the lungs every 6 (six) hours as needed for wheezing or shortness of breath. 09/29/17   Jeanmarie Plant, MD  cefUROXime (CEFTIN) 500 MG tablet Take 1 tablet (500 mg total) by mouth 2 (two) times daily with a meal. 12/21/15   Altamese Dilling, MD  dicyclomine (BENTYL) 10 MG capsule Take 1 capsule (10 mg total) by mouth 4 (four) times daily -  before meals and at bedtime. 05/28/19   Enid Derry, PA-C  naproxen (NAPROSYN) 500 MG tablet Take 1 tablet (500 mg total)  by mouth 2 (two) times daily with a meal. 01/01/18   Joni Reining, PA-C  ondansetron (ZOFRAN ODT) 4 MG disintegrating tablet Take 1 tablet (4 mg total) by mouth every 8 (eight) hours as needed for nausea or vomiting. 05/28/19   Enid Derry, PA-C  oxyCODONE-acetaminophen (ROXICET) 5-325 MG tablet Take 1 tablet by mouth every 6 (six) hours as needed for moderate pain or severe pain. 12/21/15   Altamese Dilling, MD  polyethylene glycol (MIRALAX / GLYCOLAX) packet Take 17 g by mouth daily as needed for mild constipation or moderate constipation. 12/21/15   Altamese Dilling, MD  sulfamethoxazole-trimethoprim (BACTRIM DS) 800-160 MG tablet Take 1 tablet by mouth 2 (two) times daily. 05/28/19   Enid Derry, PA-C  traMADol (ULTRAM) 50 MG tablet Take 1 tablet (50 mg total) by mouth every 12 (twelve) hours as needed. 01/01/18   Joni Reining, PA-C     Allergies Patient has no known allergies.   Family History  Problem Relation Age of Onset  . Diabetes Mother   . Kidney failure Mother   . Diabetes Father   . Diabetes Paternal Grandfather     Social History Social History   Tobacco Use  . Smoking status: Current Every Day Smoker    Packs/day: 0.25    Types: Cigarettes  . Smokeless tobacco: Former Neurosurgeon  . Tobacco comment: 3 cig/day  Vaping Use  . Vaping Use: Every  day  . Substances: Nicotine, Flavoring  Substance Use Topics  . Alcohol use: Yes    Comment: occ  . Drug use: No    Review of Systems Level 5 Caveat: Portions of the History and Physical including HPI and review of systems are unable to be completely obtained due to patient being a poor historian   Constitutional:   No known fever.  ENT:   No rhinorrhea. Cardiovascular:   No chest pain or syncope. Respiratory:   No dyspnea or cough. Gastrointestinal:   Negative for abdominal pain, vomiting and diarrhea.  Musculoskeletal:   Negative for focal pain or  swelling ____________________________________________   PHYSICAL EXAM:  VITAL SIGNS: ED Triage Vitals [11/01/19 1951]  Enc Vitals Group     BP      Pulse Rate 93     Resp 16     Temp 98 F (36.7 C)     Temp Source Oral     SpO2 99 %     Weight 250 lb (113.4 kg)     Height 6\' 4"  (1.93 m)     Head Circumference      Peak Flow      Pain Score Asleep     Pain Loc      Pain Edu?      Excl. in GC?     Vital signs reviewed, nursing assessments reviewed.   Constitutional: Awake, somnolent.  Oriented to self. Non-toxic appearance. Eyes:   Conjunctivae are normal. EOMI. PERRL. ENT      Head:   Normocephalic and atraumatic.      Nose:   No congestion/rhinnorhea.       Mouth/Throat:   MMM, no pharyngeal erythema. No peritonsillar mass.       Neck:   No meningismus. Full ROM. Hematological/Lymphatic/Immunilogical:   No cervical lymphadenopathy. Cardiovascular:   RRR. Symmetric bilateral radial and DP pulses.  No murmurs. Cap refill less than 2 seconds. Respiratory:   Normal respiratory effort without tachypnea/retractions. Breath sounds are clear and equal bilaterally. No wheezes/rales/rhonchi. Gastrointestinal:   Soft and nontender. Non distended. There is no CVA tenderness.  No rebound, rigidity, or guarding.  Musculoskeletal:   Normal range of motion in all extremities. No joint effusions.  No lower extremity tenderness.  No edema. Neurologic:   Slurred speech. Motor grossly intact. Skin:    Skin is warm, dry and intact. No rash noted.  No petechiae, purpura, or bullae.  ____________________________________________    LABS (pertinent positives/negatives) (all labs ordered are listed, but only abnormal results are displayed) Labs Reviewed  ACETAMINOPHEN LEVEL - Abnormal; Notable for the following components:      Result Value   Acetaminophen (Tylenol), Serum <10 (*)    All other components within normal limits  COMPREHENSIVE METABOLIC PANEL - Abnormal; Notable for the  following components:   Potassium 3.1 (*)    Glucose, Bld 130 (*)    Calcium 8.8 (*)    All other components within normal limits  SALICYLATE LEVEL - Abnormal; Notable for the following components:   Salicylate Lvl <7.0 (*)    All other components within normal limits  COMPREHENSIVE METABOLIC PANEL - Abnormal; Notable for the following components:   Total Bilirubin 1.5 (*)    Anion gap 3 (*)    All other components within normal limits  ACETAMINOPHEN LEVEL - Abnormal; Notable for the following components:   Acetaminophen (Tylenol), Serum <10 (*)    All other components within normal limits  SARS CORONAVIRUS 2 BY  RT PCR (HOSPITAL ORDER, [place] [facility])  ETHANOL  LIPASE, BLOOD  CBC WITH DIFFERENTIAL/PLATELET  MAGNESIUM  LIPASE, BLOOD  MAGNESIUM  URINE DRUG SCREEN, QUALITATIVE ([facility] ONLY)   ____________________________________________   EKG  Interpreted by me Sinus tachycardia rate 103.  Normal axis and intervals.  Normal QRS ST segments and T waves.  ____________________________________________    RADIOLOGY  No results found.  ____________________________________________   PROCEDURES .Critical Care Performed by: Carrie Mew, MD Authorized by: Carrie Mew, MD   Critical care provider statement:    Critical care time (minutes):  35   Critical care time was exclusive of:  Separately billable procedures and treating other patients   Critical care was necessary to treat or prevent imminent or life-threatening deterioration of the following conditions:  CNS failure or compromise and toxidrome   Critical care was time spent personally by me on the following activities:  Development of treatment plan with patient or surrogate, discussions with consultants, evaluation of patient's response to treatment, examination of patient, obtaining history from patient or surrogate, ordering and performing treatments and interventions, ordering and  review of laboratory studies, ordering and review of radiographic studies, pulse oximetry, re-evaluation of patient's condition and review of old charts    ____________________________________________   CLINICAL IMPRESSION / ASSESSMENT AND PLAN / ED COURSE  Medications ordered in the ED: Medications  charcoal activated (NO SORBITOL) (ACTIDOSE-AQUA) suspension 50 g (50 g Oral Not Given 11/01/19 2042)  ondansetron (ZOFRAN) injection 4 mg (4 mg Intravenous Given 11/01/19 2014)  sodium chloride 0.9 % bolus 1,000 mL (0 mLs Intravenous Stopped 11/01/19 2243)  lactated ringers bolus 1,000 mL (1,000 mLs Intravenous New Bag/Given 11/01/19 2319)  LORazepam (ATIVAN) injection 2 mg (2 mg Intravenous Given 11/01/19 2308)    Pertinent labs & imaging results that were available during my care of the patient were reviewed by me and considered in my medical decision making (see chart for details).   Jeffrey Joseph was evaluated in Emergency Department on 11/01/2019 for the symptoms described in the history of present illness. He was evaluated in the context of the global COVID-19 pandemic, which necessitated consideration that the patient might be at risk for infection with the SARS-CoV-2 virus that causes COVID-19. Institutional protocols and algorithms that pertain to the evaluation of patients at risk for COVID-19 are in a state of rapid change based on information released by regulatory bodies including the CDC and federal and state organizations. These policies and algorithms were followed during the patient's care in the ED.   Patient presents after intentional drug overdose at home.  He is currently maintaining his airway, not vomiting, not excessively sedated.  EKG shows normal intervals, no evidence of dysrhythmia.  Vital signs are normal on arrival, initial blood pressure about 115/80.  Will give IV fluids, oral charcoal if patient is able to drink.  Will need to observe the patient in the ED to guard  against excessive sedation, cardiac dysrhythmia, hypotension.  Poison control recommends repeat labs at 4-hour interval.  We will initiate IVC for safety pending psychiatry evaluation when patient is able to participate.  The patient has been placed in psychiatric observation due to the need to provide a safe environment for the patient while obtaining psychiatric consultation and evaluation, as well as ongoing medical and medication management to treat the patient's condition.  The patient has been placed under full IVC at this time.   Clinical Course as of Nov 01 2322  Tue Nov 01, 2019  2025 Initial labs unremarkable.   [PS]  2038 Pt not able to drink charcoal. Wife also adds that pt vomited multiple times immediately after ingestion.    [PS]  2320 Patient is now awake and alert, ambulatory.  Still slightly slurred speech.  He is becoming agitated, wanting to leave the ED.  He is under IVC.  I will give him Ativan 2 mg IV to help calm him, pending psychiatry evaluation.  Repeat lab panel is all normal.  He is medically clear at this time from his Seroquel ingestion.   [PS]    Clinical Course User Index [PS] Sharman Cheek, MD     ____________________________________________   FINAL CLINICAL IMPRESSION(S) / ED DIAGNOSES    Final diagnoses:  Intentional drug overdose, initial encounter [facility])  Suicide attempt [facility])     ED Discharge Orders    None      Portions of this note were generated with dragon dictation software. Dictation errors may occur despite best attempts at proofreading.   Sharman Cheek, MD 11/01/19 2324

## 2019-11-01 NOTE — ED Notes (Signed)
Per wife, pt immediately regretted taking all the pills and denied feeling suicidal. PT started to make himself vomit over 1 hour and was able to vomit "three grocery bags" worth of "pink orange" colored emesis.  MD made aware.

## 2019-11-01 NOTE — ED Notes (Addendum)
PT attempting to get out of bed and take IV and leads off. Staff to bedside and pt became frustrated that he was told not to get out of bed. Pt stating he does not need to be here and his mother can monitor his heart at home. RN attempted to explain again that pt was not cleared for discharge and it was not safe for him to leave. RN also attempted to explain that pt needed to be cleared by psych. Unsure if pt heard nurse as he could not keep his yes open and continues to have garbled speech that is hard to understand.   Wife made aware of IVC. Pts cell phone removed from room and wife updated he would not longer have it.

## 2019-11-01 NOTE — ED Notes (Signed)
QRS duration currently 93 QTc 463

## 2019-11-02 DIAGNOSIS — F339 Major depressive disorder, recurrent, unspecified: Secondary | ICD-10-CM | POA: Diagnosis present

## 2019-11-02 LAB — URINE DRUG SCREEN, QUALITATIVE (ARMC ONLY)
Amphetamines, Ur Screen: NOT DETECTED
Barbiturates, Ur Screen: NOT DETECTED
Benzodiazepine, Ur Scrn: NOT DETECTED
Cannabinoid 50 Ng, Ur ~~LOC~~: POSITIVE — AB
Cocaine Metabolite,Ur ~~LOC~~: NOT DETECTED
MDMA (Ecstasy)Ur Screen: NOT DETECTED
Methadone Scn, Ur: NOT DETECTED
Opiate, Ur Screen: NOT DETECTED
Phencyclidine (PCP) Ur S: NOT DETECTED
Tricyclic, Ur Screen: POSITIVE — AB

## 2019-11-02 MED ORDER — HALOPERIDOL LACTATE 5 MG/ML IJ SOLN
INTRAMUSCULAR | Status: AC
  Administered 2019-11-02: 5 mg via INTRAVENOUS
  Filled 2019-11-02: qty 1

## 2019-11-02 MED ORDER — HALOPERIDOL LACTATE 5 MG/ML IJ SOLN: 5.0000 mg | Freq: Once | INTRAMUSCULAR | Status: AC

## 2019-11-02 MED ORDER — DIPHENHYDRAMINE HCL 50 MG/ML IJ SOLN
INTRAMUSCULAR | Status: AC
  Administered 2019-11-02: 25 mg via INTRAVENOUS
  Filled 2019-11-02: qty 1

## 2019-11-02 MED ORDER — DIPHENHYDRAMINE HCL 50 MG/ML IJ SOLN: 25.0000 mg | Freq: Once | INTRAMUSCULAR | Status: AC

## 2019-11-02 NOTE — ED Notes (Signed)
Pt resting in bed. Pt continues to reach for things around the room but is remaining in bed while doing so. Pt continues to mumble but RN unable to understand. NAD noted at this time.

## 2019-11-02 NOTE — ED Notes (Signed)
Pt ambulatory to toilet in room. Pt slow and steady. Pt back in bed eating dinner tray.

## 2019-11-02 NOTE — ED Notes (Signed)
This tech as 1:1 sitter; pt lying in bed, card monitor in place, no needs voiced at this time.
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
## 2019-11-02 NOTE — ED Notes (Signed)
Assumed care of patient, patient sleeping. Vss. Patient denied SI/HI/HV this morning. Reports does not want to die. Patient calm and cooperative during vital signs. Slow speech reports going to sleep for a few hours. Wife called for update this morning,, will call back when patient is awake.

## 2019-11-02 NOTE — ED Notes (Signed)
Pt given phone to call wife. Phone given back to tech.

## 2019-11-02 NOTE — ED Notes (Signed)
Pt awake and standing in room. Pt has pulled all monitoring equipment off himself and when RN entered room and asked pt if he needed to use the restroom pt stated, "no, it is time to go home." Rn informed pt is is not time to go home yet and pt sat down and stated, "man [employer], you guys are horrible." RN remained in room for a couple minutes to allow pt to sit on bench and then asked if pt was willing to get back in bed. Pt willingly returned to treatment bed and started mumbling but RN unable to make out words. Pt reaching for things infront of him that are not ther
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
## 2019-11-02 NOTE — ED Notes (Signed)
PT stood from bed asking to go to the bathroom. Pt ambulatory to bedside toilet without assistance but urinated on the wall and floor and missed the toilet. Floor cleaned, pt cleaned as best as possible and back in bed at this time. Pt placed back on monitor.

## 2019-11-02 NOTE — ED Notes (Signed)
Pt resting calmly in treatment bed. NAD at this time.

## 2019-11-02 NOTE — ED Notes (Signed)
Pt resting in bed NAD at this time.

## 2019-11-02 NOTE — ED Notes (Addendum)
Pt taking off monitor again and searching for belongings to go home. MD made aware. RN unable to dress patient out.

## 2019-11-02 NOTE — ED Notes (Addendum)
In patient's belongings bag:  Tshirt Black boots (no socks) Black watch Black bracelet Cell phone with case  Pt remains wearing underwear and pants with ear rings in and wedding ring in place.
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
## 2019-11-02 NOTE — ED Notes (Signed)
Wife called for update. Update provided will call tomorrow.

## 2019-11-02 NOTE — ED Notes (Signed)
Jeffrey Joseph from poison control updated in patient's status. Poison control verbalized they will close out patient's case.

## 2019-11-02 NOTE — ED Notes (Signed)
Wife to visit, requesting patients cell phone.

## 2019-11-02 NOTE — ED Notes (Signed)
Pt up from bed after having taken monitor off and is stating he has to leave.

## 2019-11-02 NOTE — ED Provider Notes (Signed)
Emergency Medicine Observation Re-evaluation Note  Mandela Bello is a 24 y.o. male, seen on rounds today.  Pt initially presented to the ED for complaints of Drug Overdose Currently, the patient is resting in bed.  Wife at bedside.  Patient denies any concerns.  Wife is wondering when he will be able to go home.  Discussed that patient still needed to be evaluated by the psychiatric team.  Physical Exam  BP 116/64 (BP Location: Right Arm)   Pulse (!) 58   Temp 98.6 F (37 C) (Axillary)   Resp 17   Ht 6\' 4"  (1.93 m)   Wt 113.4 kg   SpO2 98%   BMI 30.43 kg/m  Physical Exam  Physical Exam General: No apparent distress HEENT: moist mucous membranes CV: RRR Pulm: Normal WOB GI: soft and non tender MSK: no edema or cyanosis Neuro: face symmetric, moving all extremities     ED Course / MDM  EKG:  Clinical Course as of Nov 02 1251  Tue Nov 01, 2019  2025 Initial labs unremarkable.   [PS]  2038 Pt not able to drink charcoal. Wife also adds that pt vomited multiple times immediately after ingestion.    [PS]  2320 Patient is now awake and alert, ambulatory.  Still slightly slurred speech.  He is becoming agitated, wanting to leave the ED.  He is under IVC.  I will give him Ativan 2 mg IV to help calm him, pending psychiatry evaluation.  Repeat lab panel is all normal.  He is medically clear at this time from his Seroquel ingestion.   [PS]    Clinical Course User Index [PS] 2321, MD   I have reviewed the labs performed to date as well as medications administered while in observation.  Recent changes in the last 24 hours include none Plan  Current plan is for psych eval  Patient is under full IVC at this time.   Sharman Cheek, MD 11/02/19 1257

## 2019-11-02 NOTE — BH Assessment (Addendum)
PATIENT BED AVAILABLE AFTER 1AM  Patient is to be admitted to [facility] by Psychiatric Nurse Practitioner Gillermo Murdoch.  Attending Physician will be Dr. Toni Amend.   Patient has been assigned to room 304, by [facility] Charge Nurse [place].    ER staff is aware of the admission:  [facility] ER Secretary    Dr. Scotty Court, ER MD   Thayer Ohm Patient's Nurse   Nicole Cella Patient Access.

## 2019-11-03 ENCOUNTER — Inpatient Hospital Stay
Admission: EM | Admit: 2019-11-03 | Discharge: 2019-11-04 | DRG: 882 | Disposition: A | Discharge status: Home / Self Care | Payer: 59 | Source: Intra-hospital | Attending: Psychiatry | Admitting: Psychiatry

## 2019-11-03 ENCOUNTER — Other Ambulatory Visit: Payer: Self-pay

## 2019-11-03 DIAGNOSIS — T43592A Poisoning by other antipsychotics and neuroleptics, intentional self-harm, initial encounter: Secondary | ICD-10-CM | POA: Diagnosis present

## 2019-11-03 DIAGNOSIS — F332 Major depressive disorder, recurrent severe without psychotic features: Secondary | ICD-10-CM | POA: Diagnosis present

## 2019-11-03 DIAGNOSIS — F419 Anxiety disorder, unspecified: Secondary | ICD-10-CM | POA: Diagnosis present

## 2019-11-03 DIAGNOSIS — F4325 Adjustment disorder with mixed disturbance of emotions and conduct: Secondary | ICD-10-CM | POA: Diagnosis not present

## 2019-11-03 DIAGNOSIS — T50901A Poisoning by unspecified drugs, medicaments and biological substances, accidental (unintentional), initial encounter: Secondary | ICD-10-CM

## 2019-11-03 DIAGNOSIS — F17210 Nicotine dependence, cigarettes, uncomplicated: Secondary | ICD-10-CM | POA: Diagnosis present

## 2019-11-03 DIAGNOSIS — Z20822 Contact with and (suspected) exposure to covid-19: Secondary | ICD-10-CM | POA: Diagnosis present

## 2019-11-03 MED ORDER — ALUM & MAG HYDROXIDE-SIMETH 200-200-20 MG/5ML PO SUSP: 30.0000 mL | ORAL | Status: DC | PRN

## 2019-11-03 MED ORDER — ACETAMINOPHEN 325 MG PO TABS: 650.0000 mg | ORAL_TABLET | Freq: Four times a day (QID) | ORAL | Status: DC | PRN

## 2019-11-03 MED ORDER — MAGNESIUM HYDROXIDE 400 MG/5ML PO SUSP: 30.0000 mL | Freq: Every day | ORAL | Status: DC | PRN

## 2019-11-03 NOTE — BHH Group Notes (Signed)
LCSW Group Therapy Note  11/03/2019 3:12 PM  Type of Therapy/Topic:  Group Therapy:  Balance in Life  Participation Level:  Did Not Attend  Description of Group:    This group will address the concept of balance and how it feels and looks when one is unbalanced. Patients will be encouraged to process areas in their lives that are out of balance and identify reasons for remaining unbalanced. Facilitators will guide patients in utilizing problem-solving interventions to address and correct the stressor making their life unbalanced. Understanding and applying boundaries will be explored and addressed for obtaining and maintaining a balanced life. Patients will be encouraged to explore ways to assertively make their unbalanced needs known to significant others in their lives, using other group members and facilitator for support and feedback.  Therapeutic Goals: 1. Patient will identify two or more emotions or situations they have that consume much of in their lives. 2. Patient will identify signs/triggers that life has become out of balance:  3. Patient will identify two ways to set boundaries in order to achieve balance in their lives:  4. Patient will demonstrate ability to communicate their needs through discussion and/or role plays  Summary of Patient Progress: X  Therapeutic Modalities:   Cognitive Behavioral Therapy Solution-Focused Therapy Assertiveness Training  Penni Homans MSW, LCSW 11/03/2019 3:12 PM

## 2019-11-03 NOTE — Progress Notes (Signed)
Admission Note:  23 yr male who presents IVC in no acute distress for the treatment of SI and Depression. Pt appears flat and depressed, he was receptive to staff and was calm and cooperative with admission process. Patient currently denies SI/HI/AVH and  contracts for safety upon admission. Patient was brought in for attempted suicide, per report he swallowed all of wife's prescription pill about 30 tablets.    Patient has Past medical Hx of Asthma, Anxiety at is intermittent. Patient 's skin was assessed and found to be clear of any abnormal marks no contraband found, POC and unit policies explained and understanding verbalized. Consents obtained. Food and fluids offered, and it was accepted. 15 minutes safety checks maintained will continue to monitor closely.

## 2019-11-03 NOTE — Progress Notes (Signed)
Recreation Therapy Notes  Date: 11/03/2019  Time: 9:30 am   Location: Room 21   Behavioral response: N/A   Intervention Topic: Animal Assisted therapy    Discussion/Intervention: Patient did not attend group.   Clinical Observations/Feedback:  Patient did not attend group.   Tristin Gladman LRT/CTRS        Jarret Torre 11/03/2019 12:59 PM

## 2019-11-03 NOTE — Tx Team (Signed)
Initial Treatment Plan 11/03/2019 4:56 AM De Burrs [id number]    PATIENT STRESSORS: Marital or family conflict Medication change or noncompliance Occupational concerns   PATIENT STRENGTHS: Ability for insight Communication skills Motivation for treatment/growth   PATIENT IDENTIFIED PROBLEMS: Suicidal Ideation     Depression                  DISCHARGE CRITERIA:  Improved stabilization in mood, thinking, and/or behavior Motivation to continue treatment in a less acute level of care  PRELIMINARY DISCHARGE PLAN: Outpatient therapy  PATIENT/FAMILY INVOLVEMENT: This treatment plan has been presented to and reviewed with the patient, Jeffrey Joseph, The patient and family have been given the opportunity to ask questions and make suggestions.  Trula Ore, RN 11/03/2019, 4:56 AM

## 2019-11-03 NOTE — Tx Team (Addendum)
Interdisciplinary Treatment and Diagnostic Plan Update  11/03/2019 Time of Session: 9am Jeffrey Joseph MRN: [id number]  Principal Diagnosis: <principal problem not specified>  Secondary Diagnoses: Active Problems:   MDD (major depressive disorder), recurrent episode, severe (HCC)   Current Medications:  Current Facility-Administered Medications  Medication Dose Route Frequency Provider Last Rate Last Admin   acetaminophen (TYLENOL) tablet 650 mg  650 mg Oral Q6H PRN Caroline Sauger, NP       alum & mag hydroxide-simeth (MAALOX/MYLANTA) 200-200-20 MG/5ML suspension 30 mL  30 mL Oral Q4H PRN Caroline Sauger, NP       magnesium hydroxide (MILK OF MAGNESIA) suspension 30 mL  30 mL Oral Daily PRN Caroline Sauger, NP       PTA Medications: Medications Prior to Admission  Medication Sig Dispense Refill Last Dose   albuterol (PROVENTIL HFA;VENTOLIN HFA) 108 (90 Base) MCG/ACT inhaler Inhale 2 puffs into the lungs every 6 (six) hours as needed for wheezing or shortness of breath. (Patient not taking: Reported on 11/01/2019) 1 Inhaler 2    cefUROXime (CEFTIN) 500 MG tablet Take 1 tablet (500 mg total) by mouth 2 (two) times daily with a meal. (Patient not taking: Reported on 11/01/2019) 20 tablet 0    dicyclomine (BENTYL) 10 MG capsule Take 1 capsule (10 mg total) by mouth 4 (four) times daily -  before meals and at bedtime. (Patient not taking: Reported on 11/01/2019) 15 capsule 0    naproxen (NAPROSYN) 500 MG tablet Take 1 tablet (500 mg total) by mouth 2 (two) times daily with a meal. (Patient not taking: Reported on 11/01/2019) 20 tablet 00    ondansetron (ZOFRAN ODT) 4 MG disintegrating tablet Take 1 tablet (4 mg total) by mouth every 8 (eight) hours as needed for nausea or vomiting. (Patient not taking: Reported on 11/01/2019) 8 tablet 0    oxyCODONE-acetaminophen (ROXICET) 5-325 MG tablet Take 1 tablet by mouth every 6 (six) hours as needed for moderate pain or severe pain.  (Patient not taking: Reported on 11/01/2019) 20 tablet 0    polyethylene glycol (MIRALAX / GLYCOLAX) packet Take 17 g by mouth daily as needed for mild constipation or moderate constipation. (Patient not taking: Reported on 11/01/2019) 14 each 0    sulfamethoxazole-trimethoprim (BACTRIM DS) 800-160 MG tablet Take 1 tablet by mouth 2 (two) times daily. (Patient not taking: Reported on 11/01/2019) 20 tablet 0    traMADol (ULTRAM) 50 MG tablet Take 1 tablet (50 mg total) by mouth every 12 (twelve) hours as needed. (Patient not taking: Reported on 11/01/2019) 12 tablet 0     Patient Stressors: Marital or family conflict Medication change or noncompliance Occupational concerns  Patient Strengths: Ability for insight Armed forces logistics/support/administrative officer Motivation for treatment/growth  Treatment Modalities: Medication Management, Group therapy, Case management,  1 to 1 session with clinician, Psychoeducation, Recreational therapy.   Physician Treatment Plan for Primary Diagnosis: <principal problem not specified> Long Term Goal(s):     Short Term Goals:    Medication Management: Evaluate patient's response, side effects, and tolerance of medication regimen.  Therapeutic Interventions: 1 to 1 sessions, Unit Group sessions and Medication administration.  Evaluation of Outcomes: Not Met  Physician Treatment Plan for Secondary Diagnosis: Active Problems:   MDD (major depressive disorder), recurrent episode, severe ([place])  Long Term Goal(s):     Short Term Goals:       Medication Management: Evaluate patient's response, side effects, and tolerance of medication regimen.  Therapeutic Interventions: 1 to 1 sessions, Unit Group sessions and  Medication administration.  Evaluation of Outcomes: Not Met   RN Treatment Plan for Primary Diagnosis: <principal problem not specified> Long Term Goal(s): Knowledge of disease and therapeutic regimen to maintain health will improve  Short Term Goals: Ability to  participate in decision making will improve, Ability to verbalize feelings will improve, Ability to disclose and discuss suicidal ideas and Ability to identify and develop effective coping behaviors will improve  Medication Management: RN will administer medications as ordered by provider, will assess and evaluate patient's response and provide education to patient for prescribed medication. RN will report any adverse and/or side effects to prescribing provider.  Therapeutic Interventions: 1 on 1 counseling sessions, Psychoeducation, Medication administration, Evaluate responses to treatment, Monitor vital signs and CBGs as ordered, Perform/monitor CIWA, COWS, AIMS and Fall Risk screenings as ordered, Perform wound care treatments as ordered.  Evaluation of Outcomes: Not Met   LCSW Treatment Plan for Primary Diagnosis: <principal problem not specified> Long Term Goal(s): Safe transition to appropriate next level of care at discharge, Engage patient in therapeutic group addressing interpersonal concerns.  Short Term Goals: Engage patient in aftercare planning with referrals and resources  Therapeutic Interventions: Assess for all discharge needs, 1 to 1 time with Social worker, Explore available resources and support systems, Assess for adequacy in community support network, Educate family and significant other(s) on suicide prevention, Complete Psychosocial Assessment, Interpersonal group therapy.  Evaluation of Outcomes: Not Met   Progress in Treatment: Attending groups: No. Participating in groups: No. Taking medication as prescribed: Yes. Toleration medication: Yes. Family/Significant other contact made: No, will contact:  when pt gives consent Patient understands diagnosis: Yes. Discussing patient identified problems/goals with staff: Yes. Medical problems stabilized or resolved: Yes. Denies suicidal/homicidal ideation: Yes. Issues/concerns per patient self-inventory: No. Other:  NA  New problem(s) identified: No, Describe:  None reported  New Short Term/Long Term Goal(s):Attend outpatient treatment, take medication as prescribed, develop and implement healthy coping methods  Patient Goals:  "Not to lose my temper and go home"  Discharge Plan or Barriers: Pt will return home and follow up with outpatient treatment  Reason for Continuation of Hospitalization: Medication stabilization  Estimated Length of Stay:1-7 days  Recreational Therapy: Patient: N/A Patient Goal: Patient will engage in groups without prompting or encouragement from LRT x3 group sessions within 5 recreation therapy group sessions.  Attendees: Patient:Melchor Berne 11/03/2019 11:29 AM  Physician: Alethia Berthold 11/03/2019 11:29 AM  Nursing: Collier Bullock 11/03/2019 11:29 AM  RN Care Manager: 11/03/2019 11:29 AM  Social Worker: Anise Salvo 11/03/2019 11:29 AM  Recreational Therapist: Roanna Epley 11/03/2019 11:29 AM  Other:  11/03/2019 11:29 AM  Other:  11/03/2019 11:29 AM  Other: 11/03/2019 11:29 AM    Scribe for Treatment Team: Yvette Rack, LCSW 11/03/2019 11:29 AM
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
## 2019-11-03 NOTE — Plan of Care (Signed)
Patient is a new admit.  Problem: Education: Goal: Knowledge of [facility] Education information/materials will improve Outcome: Not Progressing Goal: Emotional status will improve Outcome: Not Progressing Goal: Mental status will improve Outcome: Not Progressing Goal: Verbalization of understanding the information provided will improve Outcome: Not Progressing   Problem: Activity: Goal: Interest or engagement in activities will improve Outcome: Not Progressing Goal: Sleeping patterns will improve Outcome: Not Progressing   Problem: Coping: Goal: Ability to verbalize frustrations and anger appropriately will improve Outcome: Not Progressing Goal: Ability to demonstrate self-control will improve Outcome: Not Progressing   Problem: Health Behavior/Discharge Planning: Goal: Identification of resources available to assist in meeting health care needs will improve Outcome: Not Progressing Goal: Compliance with treatment plan for underlying cause of condition will improve Outcome: Not Progressing   Problem: Physical Regulation: Goal: Ability to maintain clinical measurements within normal limits will improve Outcome: Not Progressing   Problem: Safety: Goal: Periods of time without injury will increase Outcome: Not Progressing   Problem: Education: Goal: Ability to make informed decisions regarding treatment will improve Outcome: Not Progressing   Problem: Coping: Goal: Coping ability will improve Outcome: Not Progressing

## 2019-11-03 NOTE — [facility] Suicide Risk Assessment (Signed)
[facility] Admission Suicide Risk Assessment   Nursing information obtained from:    Demographic factors:  Male, Adolescent or young adult Current Mental Status:  NA Loss Factors:  NA Historical Factors:  Impulsivity Risk Reduction Factors:  Sense of responsibility to family, Living with another person, especially a relative  Total Time spent with patient: 1 hour Principal Problem: Adjustment disorder with mixed disturbance of emotions and conduct Diagnosis:  Principal Problem:   Adjustment disorder with mixed disturbance of emotions and conduct Active Problems:   Overdose  Subjective Data: Patient seen and chart reviewed.  24 year old man brought to the hospital day before yesterday after taking an impulsive overdose of medicine belonging to his wife.  This occurred during an argument.  He immediately tried to induce vomiting and came to the hospital for treatment.  Patient is denying any suicidal thoughts or intent.  Denies psychotic symptoms.  Expresses appropriate remorse for behavior  Continued Clinical Symptoms:  Alcohol Use Disorder Identification Test Final Score (AUDIT): 0 The "Alcohol Use Disorders Identification Test", Guidelines for Use in Primary Care, Second Edition.  World Science writer [facility]). Score between 0-7:  no or low risk or alcohol related problems. Score between 8-15:  moderate risk of alcohol related problems. Score between 16-19:  high risk of alcohol related problems. Score 20 or above:  warrants further diagnostic evaluation for alcohol dependence and treatment.   CLINICAL FACTORS:   Severe Anxiety and/or Agitation   Musculoskeletal: Strength & Muscle Tone: within normal limits Gait & Station: normal Patient leans: N/A  Psychiatric Specialty Exam: Physical Exam  Nursing note and vitals reviewed. Constitutional: He appears well-developed.  HENT:  Head: Normocephalic and atraumatic.  Eyes: Pupils are equal, round, and reactive to light. Conjunctivae  are normal.  Cardiovascular: Normal heart sounds.  Respiratory: Effort normal.  GI: Soft.  Musculoskeletal:        General: Normal range of motion.     Cervical back: Normal range of motion.  Neurological: He is alert.  Skin: Skin is warm and dry.  Psychiatric: His speech is normal and behavior is normal. Mood, judgment and thought content normal. His affect is blunt.    Review of Systems  Constitutional: Negative.   HENT: Negative.   Eyes: Negative.   Respiratory: Negative.   Cardiovascular: Negative.   Gastrointestinal: Negative.   Musculoskeletal: Negative.   Skin: Negative.   Neurological: Negative.   Psychiatric/Behavioral: Negative.     Blood pressure 131/81, pulse 74, temperature 98.7 F (37.1 C), temperature source Oral, resp. rate 18, height 6\' 2"  (1.88 m), weight 124.7 kg, SpO2 99 %.Body mass index is 35.31 kg/m.  General Appearance: Casual  Eye Contact:  Fair  Speech:  Normal Rate  Volume:  Normal  Mood:  Euthymic  Affect:  Constricted  Thought Process:  Goal Directed  Orientation:  Full (Time, Place, and Person)  Thought Content:  Logical  Suicidal Thoughts:  No  Homicidal Thoughts:  No  Memory:  Immediate;   Fair Recent;   Fair Remote;   Fair  Judgement:  Fair  Insight:  Fair  Psychomotor Activity:  Normal  Concentration:  Concentration: Fair  Recall:  of Knowledge:  Fair  Language:  Fair  Akathisia:  No  Handed:  Right  AIMS (if indicated):     Assets:  Desire for Improvement Housing Physical Health Resilience Social Support  ADL's:  Intact  Cognition:  WNL  Sleep:  Number of Hours: 3      COGNITIVE  FEATURES THAT CONTRIBUTE TO RISK:  None    SUICIDE RISK:   Minimal: No identifiable suicidal ideation.  Patients presenting with no risk factors but with morbid ruminations; may be classified as minimal risk based on the severity of the depressive symptoms  PLAN OF CARE: Continue 15-minute precautions.  Engage in individual and  group therapy.  No medication at this time.  Refer to outpatient treatment at discharge.  I certify that inpatient services furnished can reasonably be expected to improve the patient's condition.   Alethia Berthold, MD 11/03/2019, 2:45 PM

## 2019-11-03 NOTE — Progress Notes (Signed)
Pt is alert and oriented to person, place, time and situation. Pt is pleasant, calm, cooperative, denies suicidal and homicidal ideation, flat affect, makes brief eye contact, mood is depressed, denies anxiety, is focused on discharge. Pt reports that when interacting with his wife he has anger management issues which pt reports is one of his reasons for admission, reporting he got very angry and then impulsively took an overdose to cope but then immediately regretted it. Pt states and he would like to follow up with a marriage counselor or therapist. Will continue to monitor pt per Q15 minute face checks and monitor for safety and progress.
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
## 2019-11-04 NOTE — Progress Notes (Signed)
Recreation Therapy Notes  Date: 11/04/2019  Time: 9:30 am  Location: Craft room   Behavioral response: Appropriate  Intervention Topic: Stress Management    Discussion/Intervention:  Group content on today was focused on stress. The group defined stress and way to cope with stress. Participants expressed how they know when they are stresses out. Individuals described the different ways they have to cope with stress. The group stated reasons why it is important to cope with stress. Patient explained what good stress is and some examples. The group participated in the intervention "Stress Management". Individuals answered questions related to stress.  Clinical Observations/Feedback:  Patient came to group and was focused on what peers and staff had to say about stress. Individual was social with peers and staff while participating in the intervention during group.  Kirsta Probert LRT/CTRS         Terah Robey 11/04/2019 12:46 PM

## 2019-11-04 NOTE — Discharge Summary (Signed)
Physician Discharge Summary Note  Patient:  Jeffrey Joseph is an 24 y.o., male MRN:  [id number] DOB:  July 19, 1995 Patient phone:  [phone] (home)  Patient address:   [address] [place] [address],  Total Time spent with patient: 30 minutes  Date of Admission:  11/03/2019 Date of Discharge: 11/04/2019  Reason for Admission: Patient was admitted through the emergency room after presenting with overdose of medication  Principal Problem: Adjustment disorder with mixed disturbance of emotions and conduct Discharge Diagnoses: Principal Problem:   Adjustment disorder with mixed disturbance of emotions and conduct Active Problems:   Overdose   Past Psychiatric History: Past history of irritability and anger problems as an adolescent  Past Medical History:  Past Medical History:  Diagnosis Date  . Anxiety   . Asthma     Past Surgical History:  Procedure Laterality Date  . NO PAST SURGERIES     Family History:  Family History  Problem Relation Age of Onset  . Diabetes Mother   . Kidney failure Mother   . Diabetes Father   . Diabetes Paternal Grandfather    Family Psychiatric  History: Alcohol abuse in family Social History:  Social History   Substance and Sexual Activity  Alcohol Use Yes   Comment: occ     Social History   Substance and Sexual Activity  Drug Use No    Social History   Socioeconomic History  . Marital status: Married    Spouse name: Not on file  . Number of children: Not on file  . Years of education: Not on file  . Highest education level: Not on file  Occupational History  . Not on file  Tobacco Use  . Smoking status: Current Every Day Smoker    Packs/day: 0.25    Types: Cigarettes  . Smokeless tobacco: Former Systems developer  . Tobacco comment: 3 cig/day  Vaping Use  . Vaping Use: Every day  . Substances: Nicotine, Flavoring  Substance and Sexual Activity  . Alcohol use: Yes    Comment: occ  . Drug use: No  . Sexual activity: Not  on file  Other Topics Concern  . Not on file  Social History Narrative  . Not on file   Social Determinants of Health   Financial Resource Strain:   . Difficulty of Paying Living Expenses:   Food Insecurity:   . Worried About Charity fundraiser in the Last Year:   . Arboriculturist in the Last Year:   Transportation Needs:   . Film/video editor (Medical):   Marland Kitchen Lack of Transportation (Non-Medical):   Physical Activity:   . Days of Exercise per Week:   . Minutes of Exercise per Session:   Stress:   . Feeling of Stress :   Social Connections:   . Frequency of Communication with Friends and Family:   . Frequency of Social Gatherings with Friends and Family:   . Attends Religious Services:   . Active Member of Clubs or Organizations:   . Attends Archivist Meetings:   Marland Kitchen Marital Status:     Hospital Course: Patient admitted to psychiatric ward.  15-minute checks for precautions.  Patient showed no dangerous behavior.  He was cooperative and pleasant.  Showed good insight.  Completely denied any suicidal thoughts.  Patient did not appear to have symptoms consistent with depression that would necessitate medication.  No medication prescribed.  He slept well overnight and continues to deny suicidal ideation.  Patient was  given supportive and educational counseling and encouragement to engage in outpatient therapy to which he agrees.  At discharge is calm stable and does not appear to be acutely dangerous  Physical Findings: AIMS:  , ,  ,  ,    CIWA:    COWS:     Musculoskeletal: Strength & Muscle Tone: within normal limits Gait & Station: normal Patient leans: N/A  Psychiatric Specialty Exam: Physical Exam  Nursing note and vitals reviewed. Constitutional: He appears well-developed.  HENT:  Head: Normocephalic and atraumatic.  Eyes: Pupils are equal, round, and reactive to light. Conjunctivae are normal.  Cardiovascular: Normal heart sounds.  Respiratory:  Effort normal.  GI: Soft.  Musculoskeletal:        General: Normal range of motion.     Cervical back: Normal range of motion.  Neurological: He is alert.  Skin: Skin is warm and dry.  Psychiatric: Mood normal.    Review of Systems  Blood pressure 125/76, pulse 84, temperature 98.1 F (36.7 C), temperature source Oral, resp. rate 18, height 6\' 2"  (1.88 m), weight 124.7 kg, SpO2 98 %.Body mass index is 35.31 kg/m.  General Appearance: Casual  Eye Contact:  Good  Speech:  Clear and Coherent  Volume:  Normal  Mood:  Euthymic  Affect:  Congruent  Thought Process:  Goal Directed  Orientation:  Full (Time, Place, and Person)  Thought Content:  Logical  Suicidal Thoughts:  No  Homicidal Thoughts:  No  Memory:  Immediate;   Fair Recent;   Fair Remote;   Fair  Judgement:  Fair  Insight:  Fair  Psychomotor Activity:  Normal  Concentration:  Concentration: Fair  Recall:  of Knowledge:  Fair  Language:  Fair  Akathisia:  No  Handed:  Right  AIMS (if indicated):     Assets:  Desire for Improvement  ADL's:  Intact  Cognition:  WNL  Sleep:  Number of Hours: 8.75        Has this patient used any form of tobacco in the last 30 days? (Cigarettes, Smokeless Tobacco, Cigars, and/or Pipes) Yes, No  Blood Alcohol level:  Lab Results  Component Value Date   ETH <10 11/01/2019    Metabolic Disorder Labs:  No results found for: HGBA1C, MPG No results found for: PROLACTIN No results found for: CHOL, TRIG, HDL, CHOLHDL, VLDL, LDLCALC  See Psychiatric Specialty Exam and Suicide Risk Assessment completed by Attending Physician prior to discharge.  Discharge destination:  Home  Is patient on multiple antipsychotic therapies at discharge:  No   Has Patient had three or more failed trials of antipsychotic monotherapy by history:  No  Recommended Plan for Multiple Antipsychotic Therapies: NA  Discharge Instructions    Diet - low sodium heart healthy   Complete by: As  directed    Increase activity slowly   Complete by: As directed      Allergies as of 11/04/2019   No Known Allergies     Medication List    STOP taking these medications   albuterol 108 (90 Base) MCG/ACT inhaler Commonly known as: VENTOLIN HFA   cefUROXime 500 MG tablet Commonly known as: CEFTIN   dicyclomine 10 MG capsule Commonly known as: Bentyl   naproxen 500 MG tablet Commonly known as: Naprosyn   ondansetron 4 MG disintegrating tablet Commonly known as: Zofran ODT   oxyCODONE-acetaminophen 5-325 MG tablet Commonly known as: Roxicet   polyethylene glycol 17 g packet Commonly known as: MIRALAX / GLYCOLAX  sulfamethoxazole-trimethoprim 800-160 MG tablet Commonly known as: BACTRIM DS   traMADol 50 MG tablet Commonly known as: Ultram        Follow-up recommendations:  Activity:  Activity as tolerated Diet:  Regular diet Other:  Outpatient follow-up with local mental health as recommended  Comments: No prescriptions at discharge  Signed: Mordecai Rasmussen, MD 11/04/2019, 9:18 AM

## 2019-11-04 NOTE — Progress Notes (Signed)
  [facility] Adult Case Management Discharge Plan :  Will you be returning to the same living situation after discharge:  Yes,  lives with spouse At discharge, do you have transportation home?: Yes,  pt reports wife will pick up Do you have the ability to pay for your medications: Yes,  [facility]  Release of information consent forms completed and in the chart;  Patient's signature needed at discharge.  Patient to Follow up at:  Follow-up Information    [facility] Follow up on 11/14/2019.   Why: You are scheduled to meet with Rosina Lowenstein on July 5,2021 at 3pm via zoom. You are scheduled to meet with Dr. Eilene Ghazi November 15, 2019 at 3pm via zoom. Call agency next week to discuss new patient paperwork. Thank you. Contact information: [address] [place] [address] [phone]               Next level of care provider has access to [facility] Link:no  Safety Planning and Suicide Prevention discussed: Yes,  with pt; pt's wife     Has patient been referred to the Quitline?: N/A patient is not a smoker  Patient has been referred for addiction treatment: N/A  Suzan Slick, LCSW 11/04/2019, 9:41 AM

## 2019-11-04 NOTE — Progress Notes (Signed)
D: Pt alert and oriented x 4. Pt denies experiencing any pain, SI/HI, or AVH at this time. Pt reports he will be able to keep himself safe when he returns home.   A: Pt received discharge and medication education/information. Pt belongings were returned and confirmed upon discharge to include one pair of bottoms, one pair of pants, one cell phone, jewelry, and a watch. Pt states he has all of his belongings returned to him.  R: Pt verbalized understanding of discharge and medication education/information.  Pt escorted to medical mall front lobby where pt's wife picked him up.
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
## 2019-11-04 NOTE — [facility] Suicide Risk Assessment (Signed)
[facility] INPATIENT:  Family/Significant Other Suicide Prevention Education  Suicide Prevention Education:  Education Completed; Jeffrey Joseph, wife [phone] has been identified by the patient as the family member/significant other with whom the patient will be residing, and identified as the person(s) who will aid the patient in the event of a mental health crisis (suicidal ideations/suicide attempt).  With written consent from the patient, the family member/significant other has been provided the following suicide prevention education, prior to the and/or following the discharge of the patient.  The suicide prevention education provided includes the following:  Suicide risk factors  Suicide prevention and interventions  National Suicide Hotline telephone number  [facility] assessment telephone number  [facility] Emergency Assistance 911  [facility] and/or Residential Mobile Crisis Unit telephone number  Request made of family/significant other to:  Remove weapons (e.g., guns, rifles, knives), all items previously/currently identified as safety concern.    Remove drugs/medications (over-the-counter, prescriptions, illicit drugs), all items previously/currently identified as a safety concern.  The family member/significant other verbalizes understanding of the suicide prevention education information provided.  The family member/significant other agrees to remove the items of safety concern listed above. Jeffrey Joseph reports the pt has been experiencing work related stressors that led to him attempting to take prescription overdose. Jeffrey Joseph reports after the pt took the medication, he immediately made himself vomit numerous times. She denies the patient having any intention of wanting to harm himself. She denies the pt having access to guns or weapons in the home and does not report any reservation about him returning to the home. Jeffrey Joseph reports they have a good  marriage.  Jeffrey Joseph 11/04/2019, 9:37 AM

## 2019-11-04 NOTE — Progress Notes (Signed)
Patient alert and oriented x 4, affect is flat but he brightens upon approach his thoughts are organized and coherent, he denies SI/HI/AVH, isolates to self minimal interaction with peers and staff, no distress noted, 15 minutes safety checks maintained will continue to monitor.

## 2019-11-04 NOTE — [facility] Suicide Risk Assessment (Signed)
[facility] Discharge Suicide Risk Assessment   Principal Problem: Adjustment disorder with mixed disturbance of emotions and conduct Discharge Diagnoses: Principal Problem:   Adjustment disorder with mixed disturbance of emotions and conduct Active Problems:   Overdose   Total Time spent with patient: 30 minutes  Musculoskeletal: Strength & Muscle Tone: within normal limits Gait & Station: unsteady Patient leans: N/A  Psychiatric Specialty Exam: Review of Systems  Constitutional: Negative.   HENT: Negative.   Eyes: Negative.   Respiratory: Negative.   Cardiovascular: Negative.   Gastrointestinal: Negative.   Musculoskeletal: Negative.   Skin: Negative.   Neurological: Negative.   Psychiatric/Behavioral: Negative.     Blood pressure 125/76, pulse 84, temperature 98.1 F (36.7 C), temperature source Oral, resp. rate 18, height 6\' 2"  (1.88 m), weight 124.7 kg, SpO2 98 %.Body mass index is 35.31 kg/m.  General Appearance: Casual  Eye Contact::  Good  Speech:  Clear and Coherent409  Volume:  Normal  Mood:  Euthymic  Affect:  Congruent  Thought Process:  Goal Directed  Orientation:  Full (Time, Place, and Person)  Thought Content:  Logical  Suicidal Thoughts:  No  Homicidal Thoughts:  No  Memory:  Immediate;   Fair Recent;   Fair Remote;   Fair  Judgement:  Fair  Insight:  Fair  Psychomotor Activity:  Normal  Concentration:  Fair  Recall:  [phone] of Knowledge:Fair  Language: Fair  Akathisia:  No  Handed:  Right  AIMS (if indicated):     Assets:  Desire for Improvement Housing Physical Health Resilience Social Support  Sleep:  Number of Hours: 8.75  Cognition: WNL  ADL's:  Intact   Mental Status Per Nursing Assessment::   On Admission:  NA  Demographic Factors:  Male  Loss Factors: Loss of significant relationship  Historical Factors: NA  Risk Reduction Factors:   Sense of responsibility to family, Employed, Living with another person, especially a  relative and Positive social support  Continued Clinical Symptoms:  Dysthymia  Cognitive Features That Contribute To Risk:  None    Suicide Risk:  Minimal: No identifiable suicidal ideation.  Patients presenting with no risk factors but with morbid ruminations; may be classified as minimal risk based on the severity of the depressive symptoms    Plan Of Care/Follow-up recommendations:  Activity:  Activity as tolerated Diet:  Regular diet Other:  Patient will be given recommendation for referral to local mental health agency  [phone], MD 11/04/2019, 9:15 AM

## 2019-11-18 ENCOUNTER — Other Ambulatory Visit: Payer: Self-pay

## 2019-11-18 ENCOUNTER — Ambulatory Visit
Admission: RE | Admit: 2019-11-18 | Discharge: 2019-11-18 | Disposition: A | Discharge status: Home / Self Care | Payer: 59 | Source: Ambulatory Visit | Attending: Family Medicine | Admitting: Family Medicine

## 2019-11-18 VITALS — BP 124/79 | HR 68 | Temp 98.7°F | Resp 16

## 2019-11-18 DIAGNOSIS — R112 Nausea with vomiting, unspecified: Secondary | ICD-10-CM | POA: Diagnosis not present

## 2019-11-18 DIAGNOSIS — K529 Noninfective gastroenteritis and colitis, unspecified: Secondary | ICD-10-CM

## 2019-11-18 DIAGNOSIS — R197 Diarrhea, unspecified: Secondary | ICD-10-CM | POA: Diagnosis not present

## 2019-11-18 MED ORDER — OMEPRAZOLE 20 MG PO CPDR
20.0000 mg | DELAYED_RELEASE_CAPSULE | Freq: Every day | ORAL | 1 refills | Status: DC
Start: 2019-11-18 — End: 2021-11-15

## 2019-11-18 MED ORDER — ONDANSETRON HCL 4 MG PO TABS
4.0000 mg | ORAL_TABLET | Freq: Four times a day (QID) | ORAL | 0 refills | Status: DC
Start: 2019-11-18 — End: 2021-11-15

## 2019-11-18 NOTE — ED Triage Notes (Signed)
Patient reports to UC c/o episodes of bilateral lower quadrant abdominal pain, n/v/d, and headaches for over two weeks.   Denies: fever, body aches, chills, ShOB, and chest pain  OTC: omeprazole, mom gave him some antiemetics   Patient reports he was taking his omeprazole regularly and was starting to feel better. Has forgotten to take it the last three days, and began feeling bad again.

## 2019-11-18 NOTE — Discharge Instructions (Signed)
I have called in omeprazole for you to take daily  I have also sent over Zofran for nausea  If these are not helping today, and your abdominal pain gets worse, I would recommend that you go to the ER for a CT of your abdomen to rule out appendicitis as we cannot do that in the office  Follow-up with this office or with PCP as needed  Follow-up with the ER with increased pain, trouble swallowing, trouble breathing, other concerning symptoms

## 2019-11-18 NOTE — ED Provider Notes (Signed)
[facility]   [id number] 11/18/19 Arrival Time: 1458  CC: ABDOMINAL PAIN  SUBJECTIVE:  Jeffrey Joseph is a 24 y.o. male who presents with complaint of abdominal discomfort that began abruptly days ago.  Reports that it really started about 3 weeks ago.  Of note, patient had an intentional overdose of Seroquel about 3-1/2 weeks ago.  Was given charcoal and had lots of vomiting at the time.  Reports that the pain is worse in the right lower quadrant, with some radiation into the left lower quadrant at times.  Reports that he has been having nausea, vomiting, diarrhea.  Reports that he took omeprazole with some relief but that he has forgotten it for the last 2 days.  There are no aggravating or alleviating symptoms.  Last BM was today. Denies fever, chills, appetite changes, weight changes, chest pain, SOB, constipation, hematochezia, melena, dysuria, difficulty urinating, increased frequency or urgency, flank pain, loss of bowel or bladder function    No LMP for male patient.  ROS: As per HPI.  All other pertinent ROS negative.     Past Medical History:  Diagnosis Date   Anxiety    Asthma    Past Surgical History:  Procedure Laterality Date   NO PAST SURGERIES     No Known Allergies No current facility-administered medications on file prior to encounter.   No current outpatient medications on file prior to encounter.   Social History   Socioeconomic History   Marital status: Married    Spouse name: Not on file   Number of children: Not on file   Years of education: Not on file   Highest education level: Not on file  Occupational History   Not on file  Tobacco Use   Smoking status: Current Every Day Smoker    Packs/day: 0.25    Types: Cigarettes   Smokeless tobacco: Former Neurosurgeon   Tobacco comment: 3 cig/day  Vaping Use   Vaping Use: Every day   Substances: Nicotine, Flavoring  Substance and Sexual Activity   Alcohol use: Yes    Comment: occ    Drug use: No   Sexual activity: Not on file  Other Topics Concern   Not on file  Social History Narrative   Not on file   Social Determinants of Health   Financial Resource Strain:    Difficulty of Paying Living Expenses:   Food Insecurity:    Worried About Programme researcher, broadcasting/film/video in the Last Year:    Barista in the Last Year:   Transportation Needs:    Freight forwarder (Medical):    Lack of Transportation (Non-Medical):   Physical Activity:    Days of Exercise per Week:    Minutes of Exercise per Session:   Stress:    Feeling of Stress :   Social Connections:    Frequency of Communication with Friends and Family:    Frequency of Social Gatherings with Friends and Family:    Attends Religious Services:    Active Member of Clubs or Organizations:    Attends Engineer, structural:    Marital Status:   Intimate Partner Violence:    Fear of Current or Ex-Partner:    Emotionally Abused:    Physically Abused:    Sexually Abused:    Family History  Problem Relation Age of Onset   Diabetes Mother    Kidney failure Mother    Diabetes Father    Diabetes Paternal Actor  OBJECTIVE:  Vitals:   11/18/19 1506  BP: 124/79  Pulse: 68  Resp: 16  Temp: 98.7 F (37.1 C)  SpO2: 97%    General appearance: Alert; NAD HEENT: NCAT.  Oropharynx clear.  Lungs: clear to auscultation bilaterally without adventitious breath sounds Heart: regular rate and rhythm.  Radial pulses 2+ symmetrical bilaterally Abdomen: soft, non-distended; normal active bowel sounds; generalized right-sided abdominal tenderness to light and deep palpation; nontender at McBurney's point; negative Murphy's sign; negative rebound; no guarding Back: no CVA tenderness Extremities: no edema; symmetrical with no gross deformities Skin: warm and dry Neurologic: normal gait Psychological: alert and cooperative; normal mood and affect  LABS: No results found  for this or any previous visit (from the past 24 hour(s)).  DIAGNOSTIC STUDIES: No results found.   ASSESSMENT & PLAN:  1. Noninfectious gastroenteritis, unspecified type   2. Nausea and vomiting, intractability of vomiting not specified, unspecified vomiting type   3. Diarrhea, unspecified type     Meds ordered this encounter  Medications   omeprazole (PRILOSEC) 20 MG capsule    Sig: Take 1 capsule (20 mg total) by mouth daily.    Dispense:  90 capsule    Refill:  1    Order Specific Question:   Supervising Provider    Answer:   LAMPTEY, PHILIP O [[id number]]   ondansetron (ZOFRAN) 4 MG tablet    Sig: Take 1 tablet (4 mg total) by mouth every 6 (six) hours.    Dispense:  12 tablet    Refill:  0    Order Specific Question:   Supervising Provider    Answer:   Merrilee Jansky [[id number]]    Prescribed omeprazole Information handouts on GERD and diet choices given Information handout on appendicitis also given Discussed that we cannot rule out appendicitis in this office that a CT is required for that He may take his medication, if he is feeling better then he may follow-up as needed If the medications are not helping, and pain gets worse, follow-up with the ER Get rest and drink fluids Zofran prescribed.  Take as directed.    DIET Instructions:  30 minutes after taking nausea medicine, begin with sips of clear liquids. If able to hold down 2 - 4 ounces for 30 minutes, begin drinking more. Increase your fluid intake to replace losses. Clear liquids only for 24 hours (water, tea, sport drinks, clear flat ginger ale or cola and juices, broth, jello, popsicles, ect). Advance to bland foods, applesauce, rice, baked or boiled chicken, ect. Avoid milk, greasy foods and anything that doesnt agree with you.  If you experience new or worsening symptoms return or go to ER such as fever, chills, bloody or dark tarry stools, constipation, urinary symptoms, worsening abdominal discomfort,  symptoms that do not improve with medications, inability to keep fluids down.  Reviewed expectations re: course of current medical issues. Questions answered. Outlined signs and symptoms indicating need for more acute intervention. Patient verbalized understanding. After Visit Summary given.    Moshe Cipro, NP 11/18/19 1533

## 2020-09-20 IMAGING — US US ABDOMEN LIMITED
1 series · 14 of 25 positions shown · non-contrast
Comparison: July 31, 2018.

CLINICAL DATA: Acute right upper quadrant abdominal pain.

EXAM:
ULTRASOUND ABDOMEN LIMITED RIGHT UPPER QUADRANT

[Series 1: us abdomen limited · 14 of 39 slices shown]
[im 1/39]
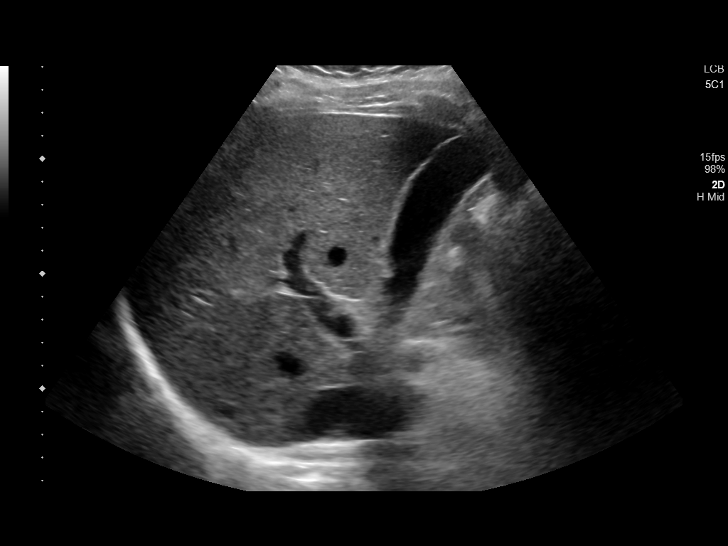
[im 4/39]
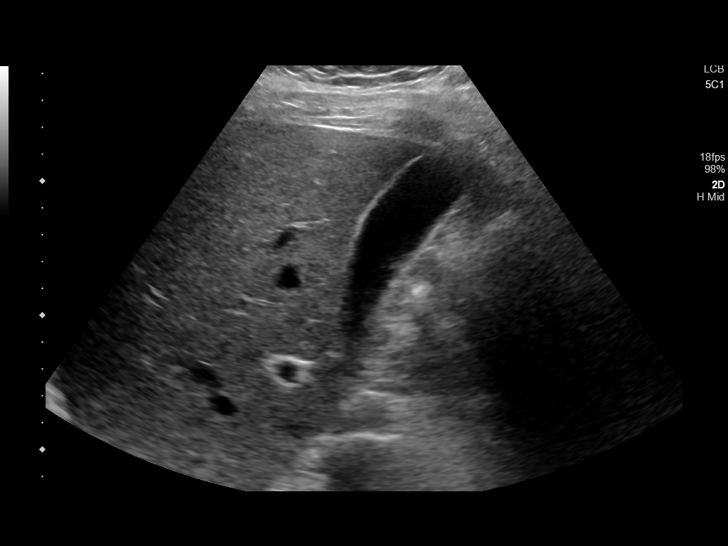
[im 7/39]
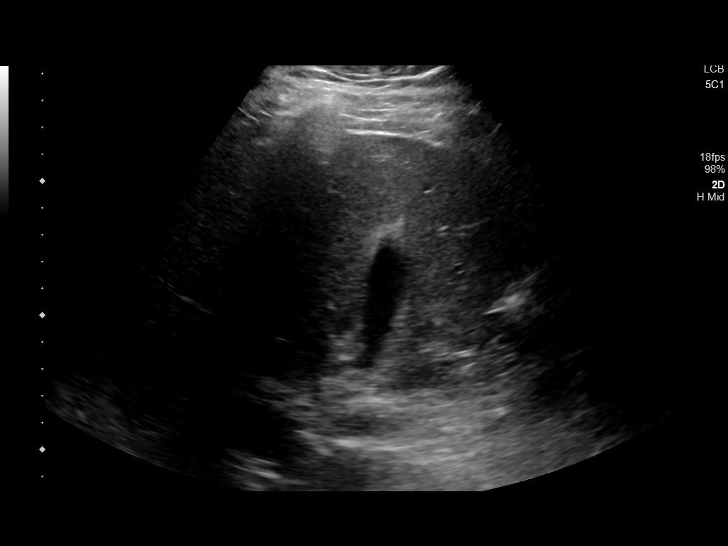
[im 10/39]
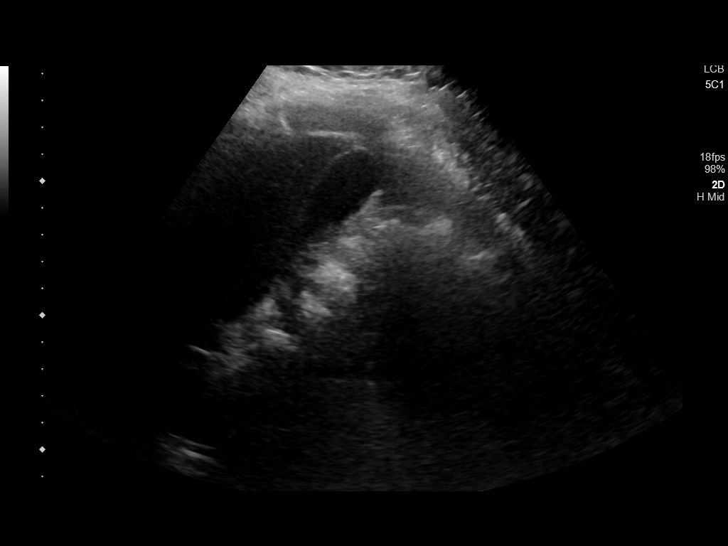
[im 13/39]
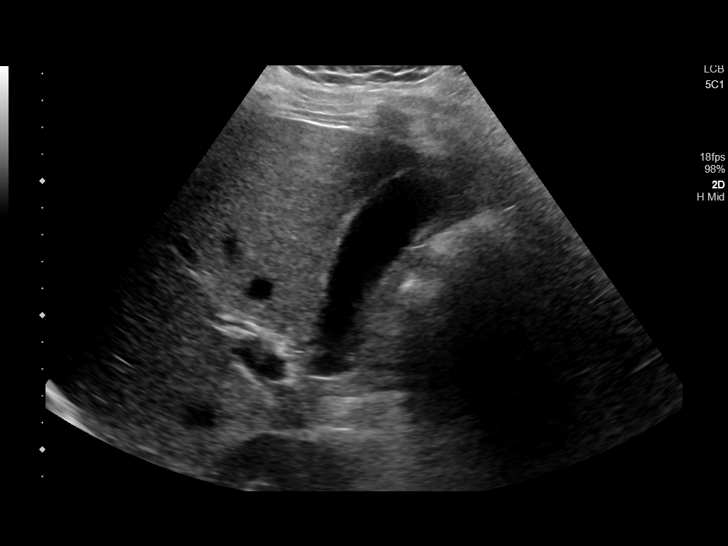
[im 15/39]
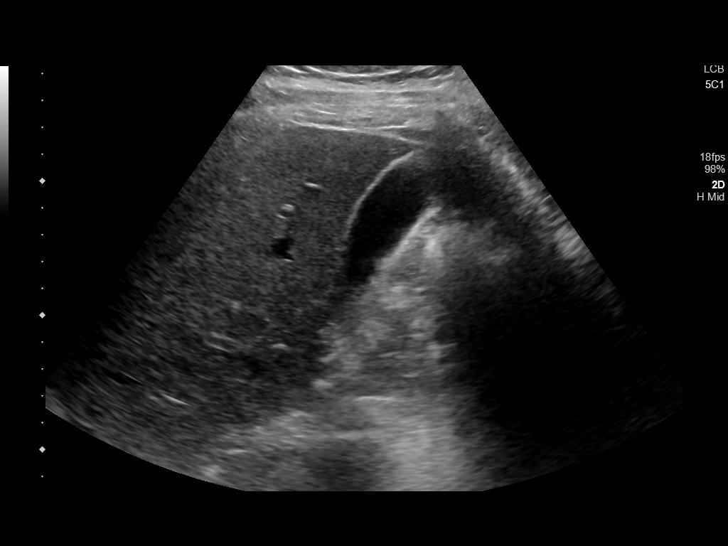
[im 18/39]
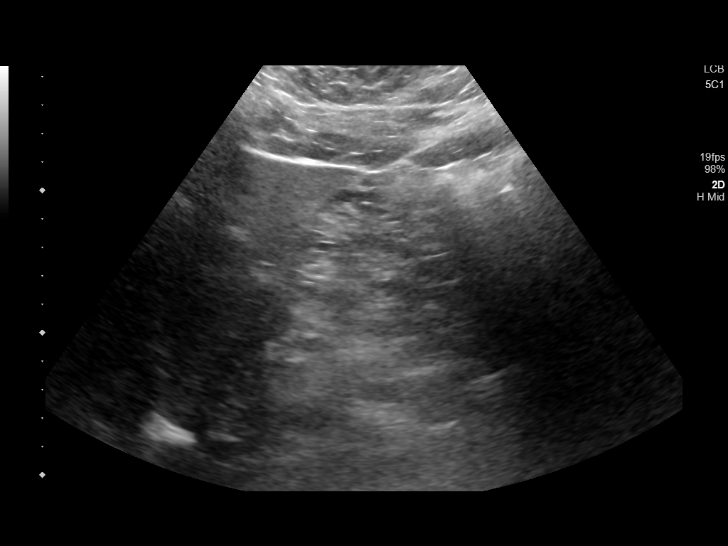
[im 21/39]
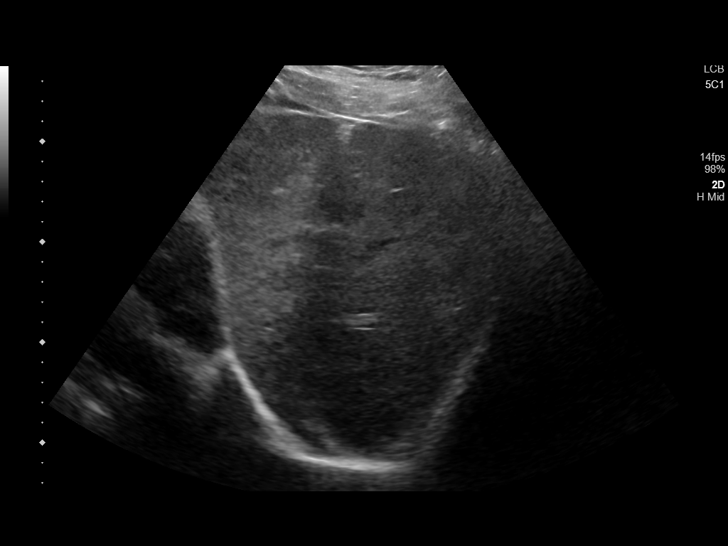
[im 24/39]
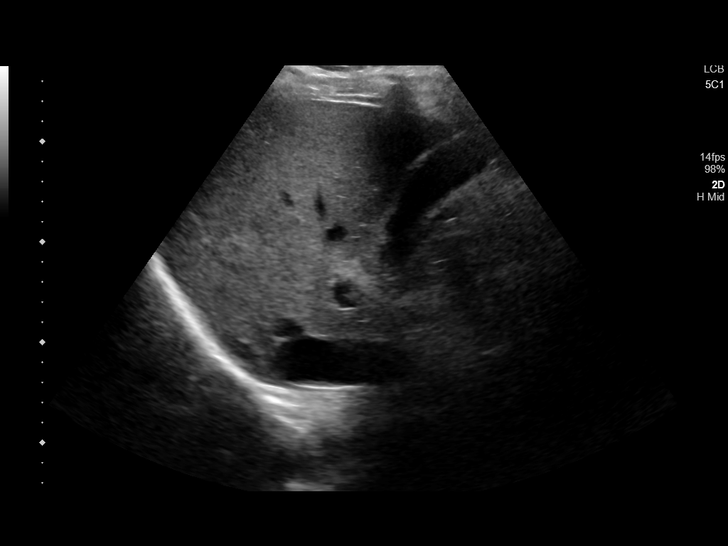
[im 26/39]
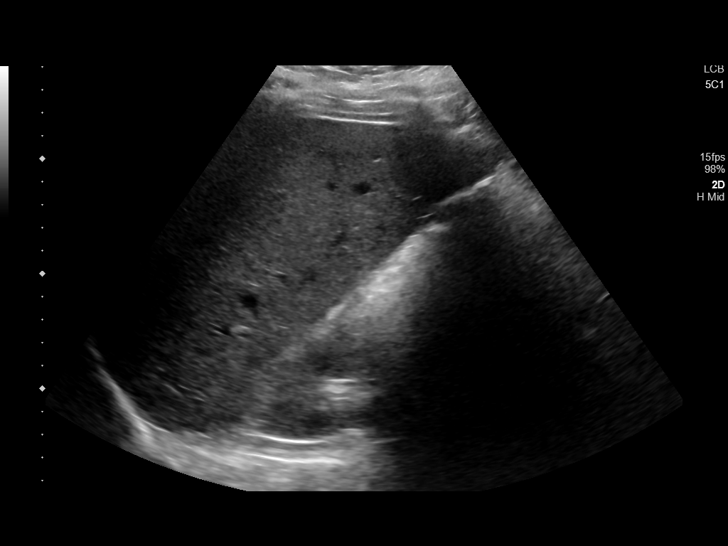
[im 29/39]
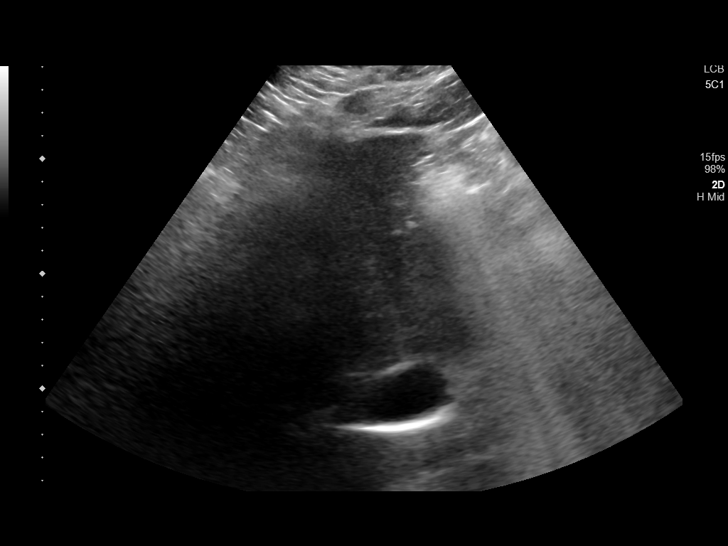
[im 32/39]
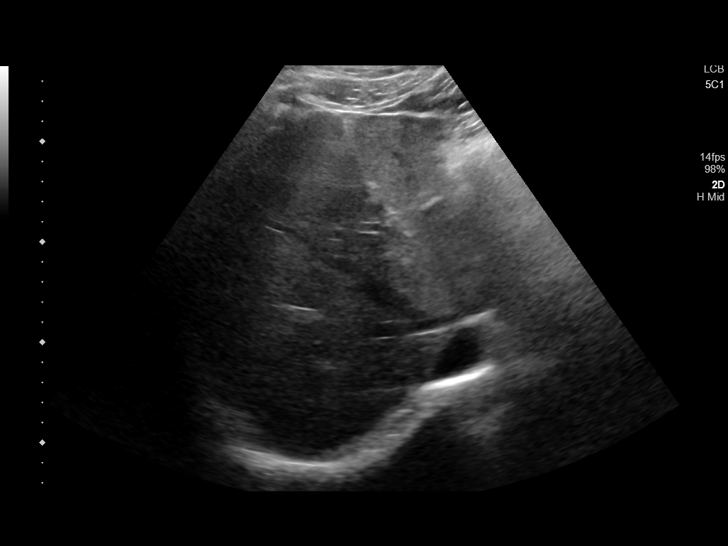
[im 35/39]
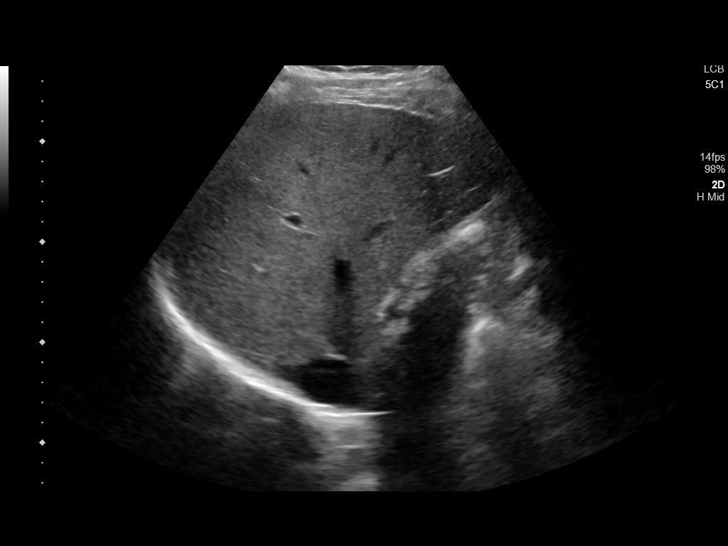
[im 39/39]
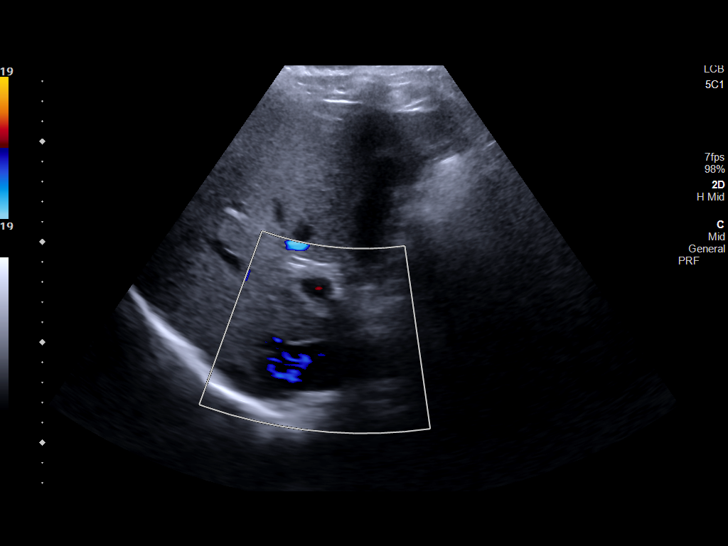

[14 of 25 positions shown; findings below may reference images not displayed]

FINDINGS: Gallbladder:

No gallstones or wall thickening visualized. No sonographic Murphy
sign noted by sonographer.

Common bile duct:

Diameter: 3 mm which is within normal limits.

Liver:

No focal lesion identified. Within normal limits in parenchymal
echogenicity. Portal vein is patent on color Doppler imaging with
normal direction of blood flow towards the liver.

Other: None.
IMPRESSION: No abnormality seen in the right upper quadrant of the abdomen.

## 2020-12-22 ENCOUNTER — Encounter: Payer: Self-pay | Admitting: Nurse Practitioner

## 2020-12-22 DIAGNOSIS — K2100 Gastro-esophageal reflux disease with esophagitis, without bleeding: Secondary | ICD-10-CM | POA: Insufficient documentation

## 2020-12-22 DIAGNOSIS — J4520 Mild intermittent asthma, uncomplicated: Secondary | ICD-10-CM | POA: Insufficient documentation

## 2020-12-26 ENCOUNTER — Ambulatory Visit: Payer: Medicaid Other | Admitting: Nurse Practitioner

## 2021-03-19 ENCOUNTER — Other Ambulatory Visit: Payer: Self-pay | Admitting: Obstetrics and Gynecology

## 2021-03-19 DIAGNOSIS — Z113 Encounter for screening for infections with a predominantly sexual mode of transmission: Secondary | ICD-10-CM

## 2021-03-19 NOTE — Progress Notes (Addendum)
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 MRN:  [id number]

## 2021-03-19 NOTE — Addendum Note (Signed)
Addended by: Althea Grimmer B on: 03/19/2021 04:53 PM   Modules accepted: Orders

## 2021-07-24 ENCOUNTER — Encounter: Payer: Self-pay | Admitting: Emergency Medicine

## 2021-07-24 ENCOUNTER — Emergency Department
Admission: EM | Admit: 2021-07-24 | Discharge: 2021-07-24 | Disposition: A | Discharge status: Home / Self Care | Payer: 59 | Attending: Emergency Medicine | Admitting: Emergency Medicine

## 2021-07-24 ENCOUNTER — Other Ambulatory Visit: Payer: Self-pay

## 2021-07-24 ENCOUNTER — Emergency Department: Payer: 59

## 2021-07-24 DIAGNOSIS — J069 Acute upper respiratory infection, unspecified: Secondary | ICD-10-CM | POA: Diagnosis not present

## 2021-07-24 DIAGNOSIS — R059 Cough, unspecified: Secondary | ICD-10-CM | POA: Diagnosis not present

## 2021-07-24 DIAGNOSIS — R0602 Shortness of breath: Secondary | ICD-10-CM | POA: Diagnosis not present

## 2021-07-24 DIAGNOSIS — Z87891 Personal history of nicotine dependence: Secondary | ICD-10-CM | POA: Insufficient documentation

## 2021-07-24 DIAGNOSIS — M546 Pain in thoracic spine: Secondary | ICD-10-CM | POA: Insufficient documentation

## 2021-07-24 DIAGNOSIS — R079 Chest pain, unspecified: Secondary | ICD-10-CM | POA: Diagnosis not present

## 2021-07-24 DIAGNOSIS — J45909 Unspecified asthma, uncomplicated: Secondary | ICD-10-CM | POA: Insufficient documentation

## 2021-07-24 MED ORDER — PSEUDOEPH-BROMPHEN-DM 30-2-10 MG/5ML PO SYRP: 5.0000 mL | ORAL_SOLUTION | Freq: Four times a day (QID) | ORAL | 0 refills | Status: DC | PRN

## 2021-07-24 MED ORDER — PREDNISONE 10 MG PO TABS: ORAL_TABLET | ORAL | 0 refills | Status: DC

## 2021-07-24 MED ORDER — ALBUTEROL SULFATE HFA 108 (90 BASE) MCG/ACT IN AERS: 2.0000 | INHALATION_SPRAY | Freq: Four times a day (QID) | RESPIRATORY_TRACT | 2 refills | Status: AC | PRN

## 2021-07-24 NOTE — ED Provider Notes (Signed)
? ?[facility] ?Provider Note ? ? ? Event Date/Time  ? First MD Initiated Contact with Patient 07/24/21 1224   ?  (approximate) ? ? ?History  ? ?Back Pain ? ? ?HPI ? ?Jeffrey Joseph is a 26 y.o. male   presents to the ED with complaint of upper back pain bilaterally with a cough and shortness of breath.  Patient has had nasal congestion for approximately 2 weeks and no one else in the family is sick with similar symptoms.  Wife states that they have been sleeping together and she has not had any symptoms such as he is.  She also states that the over-the-counter medications that she has bought has not helped with cough or congestion.  Patient was a former smoker, history of asthma and also had a questionable pneumothorax in 2017 in talking with patient. ? ?  ? ? ?Physical Exam  ? ?Triage Vital Signs: ?ED Triage Vitals  ?Enc Vitals Group  ?   BP 07/24/21 1139 133/74  ?   Pulse Rate 07/24/21 1139 70  ?   Resp 07/24/21 1139 16  ?   Temp 07/24/21 1139 97.9 ?F (36.6 ?C)  ?   Temp Source 07/24/21 1139 Oral  ?   SpO2 07/24/21 1139 100 %  ?   Weight 07/24/21 1134 274 lb 14.6 oz (124.7 kg)  ?   Height 07/24/21 1134 6\' 4"  (1.93 m)  ?   Head Circumference --   ?   Peak Flow --   ?   Pain Score 07/24/21 1134 6  ?   Pain Loc --   ?   Pain Edu? --   ?   Excl. in GC? --   ? ? ?Most recent vital signs: ?Vitals:  ? 07/24/21 1139  ?BP: 133/74  ?Pulse: 70  ?Resp: 16  ?Temp: 97.9 ?F (36.6 ?C)  ?SpO2: 100%  ? ? ? ?General: Awake, no distress.  Able to talk in complete sentences without any difficulty. ?CV:  Good peripheral perfusion.  Heart regular rate and rhythm without murmur. ?Resp:  Normal effort.  Lungs are clear at present.  No wheezes are noted. ?Abd:  No distention.  ?Other:  Moderate nasal congestion and congested cough is noted. ? ? ?ED Results / Procedures / Treatments  ? ?Labs ?(all labs ordered are listed, but only abnormal results are displayed) ?Labs Reviewed - No data to  display ? ? ? ?RADIOLOGY ? ?X-ray chest 2 views were reviewed by myself with no pneumonia noted.  Radiology report is negative for acute process but does mention scar tissue from a prior chest tube. ? ? ?PROCEDURES: ? ?Critical Care performed:  ? ?Procedures ? ? ?MEDICATIONS ORDERED IN ED: ?Medications - No data to display ? ? ?IMPRESSION / MDM / ASSESSMENT AND PLAN / ED COURSE  ?I reviewed the triage vital signs and the nursing notes. ? ? ?Differential diagnosis includes, but is not limited to, upper respiratory infection, seasonal allergies, asthma, viral process. ? ?26 year old male presents to the ED with complaint of cough, congestion for approximately 2 weeks and now shortness of breath.  He denies any fever, chills, nausea or vomiting.  Patient does have a history of asthma but does not have an inhaler currently.  He was also former smoker and has had a chest tube before for what sounds like a spontaneous pneumothorax.  Moderate nasal congestion is noted on physical exam and x-ray was reviewed and no pneumonia noted.  A prescription for an  albuterol inhaler was sent to the pharmacy as patient does not have 1 currently, prednisone taper and Bromfed-DM.  He is to follow-up with his PCP or urgent care if any continued problems.  He is encouraged to increase fluids.  Return to the emergency department if any severe worsening of his symptoms or urgent concerns. ? ? ? ?  ? ? ?FINAL CLINICAL IMPRESSION(S) / ED DIAGNOSES  ? ?Final diagnoses:  ?Upper respiratory infection, acute  ? ? ? ?Rx / DC Orders  ? ?ED Discharge Orders   ? ?      Ordered  ?  predniSONE (DELTASONE) 10 MG tablet       ? 07/24/21 1356  ?  brompheniramine-pseudoephedrine-DM 30-2-10 MG/5ML syrup  4 times daily PRN       ? 07/24/21 1356  ?  albuterol (VENTOLIN HFA) 108 (90 Base) MCG/ACT inhaler  Every 6 hours PRN       ? 07/24/21 1402  ? ?  ?  ? ?  ? ? ? ?Note:  This document was prepared using Dragon voice recognition software and may include  unintentional dictation errors. ?  ?Tommi Rumps, PA-C ?07/24/21 1406 ? ?  ?Concha Se, MD ?07/24/21 2000 ? ?

## 2021-07-24 NOTE — ED Triage Notes (Signed)
Pt comes into the ED via POV c/o upper back pain bilaterally with a cough and SHOB.  Pt in NAD at this time.  ?

## 2021-07-24 NOTE — Discharge Instructions (Signed)
Follow-up with your primary care provider, urgent care or [facility] acute care if any continued problems.  A prescription for cough medication and steroids was sent to the pharmacy.  Drink lots of fluids to stay hydrated.  Tylenol or ibuprofen as needed.  As we discussed earlier the steroids can cause interference with your appetite, sleep, energy. ?

## 2021-07-24 NOTE — ED Notes (Signed)
Coughing in the middle of the night for the past 3 weeks. Coughing till he "chokes." Pain in the posterior lung and substernal pain. No fever. Runny nose with congestion. Hx of asthma. A&Ox4. Skin p/w/d. RR even and non-labored. Has been noticing wheezing when trying to fall asleep.  ?

## 2021-11-15 ENCOUNTER — Ambulatory Visit
Admission: RE | Admit: 2021-11-15 | Discharge: 2021-11-15 | Disposition: A | Discharge status: Home / Self Care | Payer: 59 | Source: Ambulatory Visit | Attending: Urgent Care | Admitting: Urgent Care

## 2021-11-15 VITALS — BP 113/68 | HR 62 | Temp 98.1°F | Resp 18

## 2021-11-15 DIAGNOSIS — R1013 Epigastric pain: Secondary | ICD-10-CM

## 2021-11-15 LAB — POCT URINALYSIS DIP (MANUAL ENTRY)
Bilirubin, UA: NEGATIVE
Blood, UA: NEGATIVE
Glucose, UA: NEGATIVE mg/dL
Ketones, POC UA: NEGATIVE mg/dL
Leukocytes, UA: NEGATIVE
Nitrite, UA: NEGATIVE
Protein Ur, POC: NEGATIVE mg/dL
Spec Grav, UA: 1.025 (ref 1.010–1.025)
Urobilinogen, UA: 0.2 E.U./dL
pH, UA: 6 (ref 5.0–8.0)

## 2021-11-15 MED ORDER — SUCRALFATE 1 G PO TABS: 1.0000 g | ORAL_TABLET | Freq: Three times a day (TID) | ORAL | 0 refills | Status: AC

## 2021-11-15 MED ORDER — PANTOPRAZOLE SODIUM 40 MG PO TBEC: 40.0000 mg | DELAYED_RELEASE_TABLET | Freq: Every day | ORAL | 0 refills | Status: AC

## 2021-11-15 NOTE — Discharge Instructions (Signed)
Your symptoms sound most consistent with a developing peptic ulcer. Please start taking pantoprazole once daily in the morning Please take the carafate four times daily You need to establish care with a PCP for a routine health evaluation. Try to cut back on nicotine use as this can worsen GI disorders. Please do not take any OTC NSAIDs, only tylenol. If any new or worsening symptom, head to the ER, particularly if you develop recurrent vomiting, fever, or consistent abdominal pain

## 2021-11-15 NOTE — ED Provider Notes (Signed)
Renaldo Fiddler    CSN: [id number] Arrival date & time: 11/15/21  1142      History   Chief Complaint Chief Complaint  Patient presents with   Abdominal Pain    Abdominal pain/ stomach pain - Entered by patient    HPI Jeffrey Joseph is a 26 y.o. male.   26yo male with no signficiant PMH presents today with concern of epigastric pain x 2 days. He has been seen in the ER numerous times over the past year with similar complaints, with most recent being in Feb 2023. He states his pain from Feb resolved, but two days ago came back. Reports pain in epigastric region, with occasional radiation to R side. It is worse after meals, states that an empty stomach feels better. Pain described as a "squeezing sensation" or a pressure. He has had two bouts of nausea, but states no nausea in clinic at present time. Reports vomiting once yesterday. States diarrhea this morning and yesterday as well, characterized by loose stools. No hematochezia or melena. Denies fever or rash. Denies recurrent NSAID use. Does smoke nicotine. Was prescribed omeprazole at ER in the past, took yesterday which did not help with current symptoms. Labs from one year ago normal. CT scan was ordered in ER at one point, but pt left AMA and it was never completed. Pt denies urinary symptoms. States he has lost 30# without trying. Was referred to GI, never followed up. Pt is a cook at [employer].    Abdominal Pain Associated symptoms: diarrhea, nausea and vomiting   Associated symptoms: no fever and no sore throat     Past Medical History:  Diagnosis Date   Anxiety    Asthma     Patient Active Problem List   Diagnosis Date Noted   Asthma in adult, mild intermittent, uncomplicated 12/22/2020   Gastroesophageal reflux disease with esophagitis without hemorrhage 12/22/2020   MDD (major depressive disorder), recurrent episode (HCC) 11/02/2019    Past Surgical History:  Procedure Laterality Date   NO PAST SURGERIES          Home Medications    Prior to Admission medications   Medication Sig Start Date End Date Taking? Authorizing Provider  pantoprazole (PROTONIX) 40 MG tablet Take 1 tablet (40 mg total) by mouth daily. 11/15/21  Yes Aleta Manternach L, PA  sucralfate (CARAFATE) 1 g tablet Take 1 tablet (1 g total) by mouth 4 (four) times daily -  with meals and at bedtime for 10 days. 11/15/21 11/25/21 Yes Azura Tufaro L, PA  albuterol (VENTOLIN HFA) 108 (90 Base) MCG/ACT inhaler Inhale 2 puffs into the lungs every 6 (six) hours as needed for wheezing or shortness of breath. 07/24/21   Tommi Rumps, PA-C    Family History Family History  Problem Relation Age of Onset   Diabetes Mother    Kidney failure Mother    Diabetes Father    Diabetes Paternal Grandfather     Social History Social History   Tobacco Use   Smoking status: Former    Packs/day: 0.25    Types: Cigarettes   Smokeless tobacco: Former   Tobacco comments:    3 cig/day  Vaping Use   Vaping Use: Every day   Substances: Nicotine, Flavoring  Substance Use Topics   Alcohol use: Yes    Comment: occ   Drug use: No     Allergies   Patient has no known allergies.   Review of Systems Review of Systems  Constitutional:  Positive for unexpected weight change. Negative for fever.  HENT:  Negative for sore throat.   Gastrointestinal:  Positive for abdominal pain, diarrhea, nausea and vomiting. Negative for blood in stool.  As per HPI   Physical Exam Triage Vital Signs ED Triage Vitals  Enc Vitals Group     BP 11/15/21 1231 113/68     Pulse Rate 11/15/21 1231 62     Resp 11/15/21 1231 18     Temp 11/15/21 1231 98.1 F (36.7 C)     Temp Source 11/15/21 1231 Oral     SpO2 11/15/21 1231 97 %     Weight --      Height --      Head Circumference --      Peak Flow --      Pain Score 11/15/21 1241 6     Pain Loc --      Pain Edu? --      Excl. in GC? --    No data found.  Updated Vital Signs BP 113/68 (BP  Location: Left Arm)   Pulse 62   Temp 98.1 F (36.7 C) (Oral)   Resp 18   SpO2 97%   Visual Acuity Right Eye Distance:   Left Eye Distance:   Bilateral Distance:    Right Eye Near:   Left Eye Near:    Bilateral Near:     Physical Exam Vitals and nursing note reviewed.  Constitutional:      General: He is not in acute distress.    Appearance: He is well-developed. He is not ill-appearing, toxic-appearing or diaphoretic.  HENT:     Head: Normocephalic and atraumatic.     Mouth/Throat:     Mouth: Mucous membranes are moist.     Pharynx: Oropharynx is clear. No pharyngeal swelling or oropharyngeal exudate.  Eyes:     General: No scleral icterus.    Extraocular Movements: Extraocular movements intact.     Pupils: Pupils are equal, round, and reactive to light.  Cardiovascular:     Rate and Rhythm: Normal rate and regular rhythm.     Heart sounds: Normal heart sounds. No murmur heard. Pulmonary:     Effort: Pulmonary effort is normal. No respiratory distress.     Breath sounds: Normal breath sounds. No stridor. No wheezing, rhonchi or rales.  Chest:     Chest wall: No tenderness.  Abdominal:     General: Abdomen is flat. Bowel sounds are normal. There is no distension or abdominal bruit. There are no signs of injury.     Palpations: Abdomen is soft. There is no hepatomegaly, splenomegaly, mass or pulsatile mass.     Tenderness: There is abdominal tenderness (mild; no rebound, rigidity or signs of peritonitis.). There is no right CVA tenderness, left CVA tenderness, guarding or rebound. Negative signs include Murphy's sign and McBurney's sign.     Hernia: No hernia is present.  Skin:    General: Skin is warm and dry.     Capillary Refill: Capillary refill takes less than 2 seconds.     Coloration: Skin is not cyanotic or pale.     Findings: No erythema or rash.  Neurological:     General: No focal deficit present.     Mental Status: He is alert and oriented to person,  place, and time.  Psychiatric:        Mood and Affect: Mood normal.        Behavior: Behavior normal.  UC Treatments / Results  Labs (all labs ordered are listed, but only abnormal results are displayed) Labs Reviewed  POCT URINALYSIS DIP (MANUAL ENTRY)    EKG   Radiology No results found.  Procedures Procedures (including critical care time)  Medications Ordered in UC Medications - No data to display  Initial Impression / Assessment and Plan / UC Course  I have reviewed the triage vital signs and the nursing notes.  Pertinent labs & imaging results that were available during my care of the patient were reviewed by me and considered in my medical decision making (see chart for details).     Epigastric pain - pt has had recurrent symptoms. Sounds most concerning of suspected peptic ulcer disease. Additional considerations include gastritis, hiatal hernia, esophageal spasms. Less likely causes biliary dyskinesia or gallstones. Negative murphy sign in office and normal UA. Discussed obtaining labs, however our vials are on backorder therefore no serum could be obtained today. Unable to obtain KUB in clinic, although this was also discussed. VSS, I feel like outpatient workup is appropriate, especially given the recurrent nature of this. Pt must establish with a PCP and get appropriate GI referral. Encouraged cutting back/ cessation of cigarettes if possible.  Final Clinical Impressions(s) / UC Diagnoses   Final diagnoses:  Abdominal pain, epigastric     Discharge Instructions      Your symptoms sound most consistent with a developing peptic ulcer. Please start taking pantoprazole once daily in the morning Please take the carafate four times daily You need to establish care with a PCP for a routine health evaluation. Try to cut back on nicotine use as this can worsen GI disorders. Please do not take any OTC NSAIDs, only tylenol. If any new or worsening symptom,  head to the ER, particularly if you develop recurrent vomiting, fever, or consistent abdominal pain    ED Prescriptions     Medication Sig Dispense Auth. Provider   pantoprazole (PROTONIX) 40 MG tablet Take 1 tablet (40 mg total) by mouth daily. 30 tablet Maribel Luis L, PA   sucralfate (CARAFATE) 1 g tablet Take 1 tablet (1 g total) by mouth 4 (four) times daily -  with meals and at bedtime for 10 days. 40 tablet Tandrea Kommer, [place] L, [place]      PDMP not reviewed this encounter.   Maretta Bees, [place] 11/15/21 1420

## 2021-11-15 NOTE — ED Triage Notes (Signed)
Pt presents with center abdominal pain that he describes as a tight feeling. Pt denies nausea today but he has had nausea and vomiting with his symptoms.

## 2021-11-25 ENCOUNTER — Emergency Department
Admission: EM | Admit: 2021-11-25 | Discharge: 2021-11-26 | Disposition: A | Discharge status: Home / Self Care | Payer: 59 | Attending: Emergency Medicine | Admitting: Emergency Medicine

## 2021-11-25 ENCOUNTER — Other Ambulatory Visit: Payer: Self-pay

## 2021-11-25 ENCOUNTER — Encounter: Payer: Self-pay | Admitting: *Deleted

## 2021-11-25 DIAGNOSIS — S61212A Laceration without foreign body of right middle finger without damage to nail, initial encounter: Secondary | ICD-10-CM | POA: Insufficient documentation

## 2021-11-25 DIAGNOSIS — S60940A Unspecified superficial injury of right index finger, initial encounter: Secondary | ICD-10-CM | POA: Diagnosis not present

## 2021-11-25 DIAGNOSIS — S61210A Laceration without foreign body of right index finger without damage to nail, initial encounter: Secondary | ICD-10-CM | POA: Diagnosis not present

## 2021-11-25 DIAGNOSIS — J45909 Unspecified asthma, uncomplicated: Secondary | ICD-10-CM | POA: Insufficient documentation

## 2021-11-25 DIAGNOSIS — W260XXA Contact with knife, initial encounter: Secondary | ICD-10-CM | POA: Diagnosis not present

## 2021-11-25 MED ORDER — LIDOCAINE HCL (PF) 1 % IJ SOLN
10.0000 mL | Freq: Once | INTRAMUSCULAR | Status: AC
  Administered 2021-11-25: 10 mL
  Filled 2021-11-25: qty 10

## 2021-11-25 MED ORDER — HYDROCODONE-ACETAMINOPHEN 5-325 MG PO TABS
1.0000 | ORAL_TABLET | Freq: Once | ORAL | Status: AC
  Administered 2021-11-25: 1 via ORAL
  Filled 2021-11-25: qty 1

## 2021-11-25 NOTE — ED Provider Triage Note (Signed)
  Emergency Medicine Provider Triage Evaluation Note  Jeffrey Joseph , a 25 y.o.male,  was evaluated in triage.  Pt complains of laceration to the right index finger and middle finger.  Reports cutting himself with a kitchen knife while trying to cook today.  No significant pain at this time.  Bleeding controlled with direct pressure.   Review of Systems  Positive: Finger lacerations Negative: Denies fever, chest pain, vomiting  Physical Exam  There were no vitals filed for this visit. Gen:   Awake, no distress   Resp:  Normal effort  MSK:   Moves extremities without difficulty  Other:    Medical Decision Making  Given the patient's initial medical screening exam, the following diagnostic evaluation has been ordered. The patient will be placed in the appropriate treatment space, once one is available, to complete the evaluation and treatment. I have discussed the plan of care with the patient and I have advised the patient that an ED physician or mid-level practitioner will reevaluate their condition after the test results have been received, as the results may give them additional insight into the type of treatment they may need.    Diagnostics: None immediately.  Treatments: none immediately   Varney Daily, [place] 11/25/21 1932

## 2021-11-25 NOTE — ED Triage Notes (Signed)
Pt has a laceration to right index finger and middle finger.  Pt cut self with a knife.  Bleeding controlled.

## 2021-11-26 MED ORDER — CEPHALEXIN 500 MG PO CAPS: 1000.0000 mg | ORAL_CAPSULE | Freq: Two times a day (BID) | ORAL | 0 refills | Status: AC

## 2021-11-26 NOTE — ED Provider Notes (Signed)
[facility] Provider Note    Event Date/Time   First MD Initiated Contact with Patient 11/25/21 2050     (approximate)   History   Chief Complaint Laceration   HPI Jeffrey Joseph is a 26 y.o. male, history of MDD, asthma, anxiety, presents to the emergency department for evaluation of laceration to the right index finger and middle finger after accidentally cutting himself with a knife while cooking.  This event occurred within the past few hours.  Bleeding controlled with direct pressure.  Denies blood thinner use.  Reports he is up-to-date with his tetanus immunization.  Denies any other injuries or illnesses at this time.  History Limitations: No limitations.        Physical Exam  Triage Vital Signs: ED Triage Vitals  Enc Vitals Group     BP 11/25/21 1932 (!) 151/67     Pulse Rate 11/25/21 1932 78     Resp 11/25/21 1932 20     Temp 11/25/21 1932 98.5 F (36.9 C)     Temp Source 11/25/21 1932 Oral     SpO2 11/25/21 1932 98 %     Weight 11/25/21 1930 250 lb (113.4 kg)     Height 11/25/21 1930 6\' 4"  (1.93 m)     Head Circumference --      Peak Flow --      Pain Score 11/25/21 1930 0     Pain Loc --      Pain Edu? --      Excl. in GC? --     Most recent vital signs: Vitals:   11/25/21 1932  BP: (!) 151/67  Pulse: 78  Resp: 20  Temp: 98.5 F (36.9 C)  SpO2: 98%    General: Awake, NAD. Skin: Warm, dry. No rashes or lesions.  Eyes: PERRL. Conjunctivae normal.  CV: Good peripheral perfusion.  Resp: Normal effort.  Abd: Soft, non-tender. No distention.  Neuro: At baseline. No gross neurological deficits.   Focused Exam: Two 3 cm linear lacerations parallel to each other along the flexor aspect of the right index finger with some exposed subcutaneous tissue.  No active bleeding or discharge.  Patient maintains full range of motion of the finger including flexion and extension along the DIP and PIP joints.  No surrounding warmth or  erythema.  No bony tenderness.  No foreign bodies present.  Sensation and perfusion are intact distally.  An additional 2 cm linear laceration present along the lateral aspect of the right middle finger adjacent to the PIP joint.  Full range of motion present in his finger as well, including flexion and extension at the DIP and PIP joints.  No foreign bodies present.  Sensation and perfusion are intact distally.  Physical Exam    ED Results / Procedures / Treatments  Labs (all labs ordered are listed, but only abnormal results are displayed) Labs Reviewed - No data to display   EKG N/A.   RADIOLOGY  ED Provider Interpretation: N/A.  No results found.  PROCEDURES:  Critical Care performed: N/A.  Marland Kitchen.Laceration Repair  Date/Time: 11/26/2021 1:41 PM  Performed by: Varney Daily, PA Authorized by: Varney Daily, PA   Consent:    Consent obtained:  Verbal   Consent given by:  Patient   Risks, benefits, and alternatives were discussed: yes     Risks discussed:  Infection, pain, retained foreign body, tendon damage, poor cosmetic result, vascular damage and poor wound healing   Alternatives discussed:  No treatment Universal protocol:    Patient identity confirmed:  Verbally with patient Anesthesia:    Anesthesia method:  Local infiltration   Local anesthetic:  Lidocaine 1% w/o epi Laceration details:    Location:  Finger   Finger location:  R index finger   Length (cm):  3   Depth (mm):  2 Pre-procedure details:    Preparation:  Patient was prepped and draped in usual sterile fashion Exploration:    Hemostasis achieved with:  Direct pressure   Wound exploration: wound explored through full range of motion and entire depth of wound visualized     Wound extent: no nerve damage noted, no tendon damage noted, no underlying fracture noted and no vascular damage noted     Contaminated: no   Treatment:    Area cleansed with:  Soap and water   Amount of  cleaning:  Extensive   Irrigation solution:  Tap water   Irrigation volume:  3000 ml   Irrigation method:  Tap Skin repair:    Repair method:  Sutures   Suture size:  5-0   Suture material:  Prolene   Suture technique:  Running locked   Number of sutures:  6 Approximation:    Approximation:  Close Repair type:    Repair type:  Simple Post-procedure details:    Dressing:  Sterile dressing and tube gauze   Procedure completion:  Tolerated well, no immediate complications .Marland KitchenLaceration Repair  Date/Time: 11/26/2021 1:43 PM  Performed by: Varney Daily, PA Authorized by: Varney Daily, PA   Consent:    Consent obtained:  Verbal   Consent given by:  Patient   Risks discussed:  Infection, pain, retained foreign body, need for additional repair, tendon damage and vascular damage   Alternatives discussed:  No treatment Universal protocol:    Patient identity confirmed:  Verbally with patient Anesthesia:    Anesthesia method:  Local infiltration   Local anesthetic:  Lidocaine 1% w/o epi Laceration details:    Location:  Finger   Finger location:  R index finger   Length (cm):  3   Depth (mm):  2 Pre-procedure details:    Preparation:  Patient was prepped and draped in usual sterile fashion Exploration:    Hemostasis achieved with:  Direct pressure   Wound extent: no foreign bodies/material noted, no tendon damage noted, no underlying fracture noted and no vascular damage noted   Treatment:    Area cleansed with:  Soap and water   Amount of cleaning:  Extensive   Irrigation solution:  Tap water   Irrigation volume:  3000 ml   Irrigation method:  Tap Skin repair:    Repair method:  Sutures   Suture size:  6-0   Suture material:  Prolene   Suture technique:  Running locked   Number of sutures:  6 Approximation:    Approximation:  Close Repair type:    Repair type:  Simple Post-procedure details:    Dressing:  Sterile dressing and tube gauze   Procedure  completion:  Tolerated well, no immediate complications .Marland KitchenLaceration Repair  Date/Time: 11/26/2021 1:45 PM  Performed by: Varney Daily, PA Authorized by: Varney Daily, PA   Consent:    Consent obtained:  Verbal   Consent given by:  Patient   Risks, benefits, and alternatives were discussed: yes     Risks discussed:  Infection, pain, retained foreign body, tendon damage and vascular damage   Alternatives discussed:  No treatment Universal protocol:  Patient identity confirmed:  Verbally with patient Anesthesia:    Anesthesia method:  None Laceration details:    Location:  Finger   Finger location:  R long finger   Length (cm):  2   Depth (mm):  1 Pre-procedure details:    Preparation:  Patient was prepped and draped in usual sterile fashion Exploration:    Hemostasis achieved with:  Direct pressure   Wound exploration: wound explored through full range of motion and entire depth of wound visualized     Wound extent: no nerve damage noted, no tendon damage noted, no underlying fracture noted and no vascular damage noted   Treatment:    Area cleansed with:  Soap and water   Amount of cleaning:  Extensive   Irrigation solution:  Tap water   Irrigation volume:  3000 ml   Irrigation method:  Tap Skin repair:    Repair method:  Tissue adhesive Approximation:    Approximation:  Close Repair type:    Repair type:  Simple Post-procedure details:    Dressing:  Open (no dressing)   Procedure completion:  Tolerated well, no immediate complications     MEDICATIONS ORDERED IN ED: Medications  HYDROcodone-acetaminophen (NORCO/VICODIN) 5-325 MG per tablet 1 tablet (1 tablet Oral Given 11/25/21 2306)  lidocaine (PF) (XYLOCAINE) 1 % injection 10 mL (10 mLs Infiltration Given by Other 11/25/21 2306)     IMPRESSION / MDM / ASSESSMENT AND PLAN / ED COURSE  I reviewed the triage vital signs and the nursing notes.                              Differential diagnosis  includes, but is not limited to, lacerations, foreign bodies, flexor tendon injury  ED Course Patient appears well, vitals within normal limits.  NAD.  Will treat pain with hydrocodone/acetaminophen.    Assessment/Plan Patient presents with multiple lacerations along the right index finger and middle finger after accidentally cutting his fingers with a kitchen knife.  No evidence of flexor or extensor tendon injuries.  Patient maintains full range of motion of his digits.  No evidence of foreign bodies.  Lacerations were repaired without incident (see above for details).  Will provide antibiotic prophylaxis given the nature and location of the injury.  Encouraged the patient to take Tylenol/ibuprofen as needed for pain.  Advised him to return to the emergency department or urgent care for suture removal in 7 to 10 days.  We will plan to discharge.  Patient's presentation is most consistent with acute, uncomplicated illness.   Provided the patient with anticipatory guidance, return precautions, and educational material. Encouraged the patient to return to the emergency department at any time if they begin to experience any new or worsening symptoms. Patient expressed understanding and agreed with the plan.       FINAL CLINICAL IMPRESSION(S) / ED DIAGNOSES   Final diagnoses:  Laceration of right index finger without foreign body without damage to nail, initial encounter     Rx / DC Orders   ED Discharge Orders          Ordered    cephALEXin (KEFLEX) 500 MG capsule  2 times daily        11/26/21 0009             Note:  This document was prepared using Dragon voice recognition software and may include unintentional dictation errors.   Varney Daily, [place] 11/26/21 1346  Georga Hacking, MD 11/27/21 [phone]

## 2021-11-26 NOTE — Discharge Instructions (Addendum)
-  Take all of your antibiotics as prescribed  -Return here or to urgent care in 7-10 days for suture removal  -The dermabond glue will dissolve on it's own in 5-7 days  -Return to the emergency department at any time if you begin to experience any new or worsening symptoms.

## 2021-11-28 DIAGNOSIS — S61210A Laceration without foreign body of right index finger without damage to nail, initial encounter: Secondary | ICD-10-CM | POA: Diagnosis not present

## 2022-03-20 ENCOUNTER — Emergency Department: Payer: 59

## 2022-03-20 ENCOUNTER — Ambulatory Visit: Payer: Self-pay

## 2022-03-20 ENCOUNTER — Emergency Department
Admission: EM | Admit: 2022-03-20 | Discharge: 2022-03-20 | Disposition: A | Discharge status: Home / Self Care | Payer: 59 | Attending: Emergency Medicine | Admitting: Emergency Medicine

## 2022-03-20 ENCOUNTER — Other Ambulatory Visit: Payer: Self-pay

## 2022-03-20 DIAGNOSIS — R079 Chest pain, unspecified: Secondary | ICD-10-CM | POA: Diagnosis not present

## 2022-03-20 DIAGNOSIS — R059 Cough, unspecified: Secondary | ICD-10-CM | POA: Diagnosis not present

## 2022-03-20 DIAGNOSIS — R0602 Shortness of breath: Secondary | ICD-10-CM | POA: Diagnosis not present

## 2022-03-20 DIAGNOSIS — R5383 Other fatigue: Secondary | ICD-10-CM | POA: Insufficient documentation

## 2022-03-20 LAB — BASIC METABOLIC PANEL
Anion gap: 7 (ref 5–15)
BUN: 12 mg/dL (ref 6–20)
CO2: 27 mmol/L (ref 22–32)
Calcium: 9.7 mg/dL (ref 8.9–10.3)
Chloride: 106 mmol/L (ref 98–111)
Creatinine, Ser: 0.98 mg/dL (ref 0.61–1.24)
GFR, Estimated: >60 mL/min (ref >60)
Glucose, Bld: 101 mg/dL — ABNORMAL HIGH (ref 70–99)
Potassium: 3.8 mmol/L (ref 3.5–5.1)
Sodium: 140 mmol/L (ref 135–145)

## 2022-03-20 LAB — CBC
HCT: 45.5 % (ref 39.0–52.0)
Hemoglobin: 15.1 g/dL (ref 13.0–17.0)
MCH: 27.1 pg (ref 26.0–34.0)
MCHC: 33.2 g/dL (ref 30.0–36.0)
MCV: 81.7 fL (ref 80.0–100.0)
Platelets: 301 10*3/uL (ref 150–400)
RBC: 5.57 MIL/uL (ref 4.22–5.81)
RDW: 12.6 % (ref 11.5–15.5)
WBC: 8.6 10*3/uL (ref 4.0–10.5)
nRBC: 0 % (ref 0.0–0.2)

## 2022-03-20 LAB — HEPATIC FUNCTION PANEL
ALT: 30 U/L (ref 0–44)
AST: 23 U/L (ref 15–41)
Albumin: 4.5 g/dL (ref 3.5–5.0)
Alkaline Phosphatase: 46 U/L (ref 38–126)
Bilirubin, Direct: <0.1 mg/dL (ref 0.0–0.2)
Total Bilirubin: 0.9 mg/dL (ref 0.3–1.2)
Total Protein: 7.8 g/dL (ref 6.5–8.1)

## 2022-03-20 LAB — T4, FREE: Free T4: 0.75 ng/dL (ref 0.61–1.12)

## 2022-03-20 LAB — TSH: TSH: 3.8 u[IU]/mL (ref 0.350–4.500)

## 2022-03-20 NOTE — ED Provider Notes (Signed)
[facility] Provider Note    Event Date/Time   First MD Initiated Contact with Patient 03/20/22 1232     (approximate)   History   Fatigue   HPI  Jeffrey Joseph is a 26 y.o. male who comes in with increasing fatigue over the past 4 years.  Patient has a remote history of empyema of the lung.  He reports having increasing fatigue need to sleep 10 hours a day as well as having intermittent bleeding from his gums and darkness under his eyes.  He is worried that he could have cancer.  He had tried to make a follow-up appointment with a primary care doctor but had missed the appointment and when he tried to make a new appointment it was seen to be until March so he was told to come into the ER to be evaluated.  He denies any history of cancer denies any lymph node swelling, blood in his stools, constant headaches with vomiting.  He reports that his family history does have cancer in it but he is not sure what time.  He knows that he had a family member who had breast cancer but is not sure otherwise.  Physical Exam   Triage Vital Signs: ED Triage Vitals  Enc Vitals Group     BP 03/20/22 1003 138/76     Pulse Rate 03/20/22 1003 87     Resp 03/20/22 1003 18     Temp 03/20/22 1003 98.2 F (36.8 C)     Temp Source 03/20/22 1003 Oral     SpO2 03/20/22 1003 100 %     Weight --      Height --      Head Circumference --      Peak Flow --      Pain Score 03/20/22 1004 0     Pain Loc --      Pain Edu? --      Excl. in GC? --     Most recent vital signs: Vitals:   03/20/22 1003  BP: 138/76  Pulse: 87  Resp: 18  Temp: 98.2 F (36.8 C)  SpO2: 100%     General: Awake, no distress.  CV:  Good peripheral perfusion.  Resp:  Normal effort.  Abd:  No distention.  Other:  No lymphadenopathy palpated underneath the armpits or in the neck denies any groin lymphadenopathy.  His lungs sound clear to auscultation abdomen is soft and nontender   ED Results /  Procedures / Treatments   Labs (all labs ordered are listed, but only abnormal results are displayed) Labs Reviewed  BASIC METABOLIC PANEL - Abnormal; Notable for the following components:      Result Value   Glucose, Bld 101 (*)    All other components within normal limits  CBC  TSH  T4, FREE     RADIOLOGY I have reviewed the xray personally and interpreted and patient has some phonic elevation of right hemidiaphragm but no acute process   PROCEDURES:  Critical Care performed: No  Procedures   MEDICATIONS ORDERED IN ED: Medications - No data to display   IMPRESSION / MDM / ASSESSMENT AND PLAN / ED COURSE  I reviewed the triage vital signs and the nursing notes.   Patient's presentation is most consistent with acute presentation with potential threat to life or bodily function.   Discussed with patient that we do not typically diagnose people with cancers in the ER and that sometimes we happen upon cancer  diagnosis is but we do not do full evaluations for cancer that this would need a primary care doctor to further work-up and or seeing a oncologist.  However the basic screening labs that we have done today look reassuring without any evidence of leukocytosis platelets are normal kidney function normal.  Other than cancer I did consider thyroid dysfunction but this was normal as well as discussed with the possibility of HIV but he reports that his wife recently had testing that was negative he denies any IV drug use, anal sex with men or significant past sexual history to put him at high risk.  We discussed that if this was a concern for him he could get testing at the health department.  Patient will be added on a chest x-ray just given history of empyema to ensure no signs of any lung mass.  Given abdomen is currently soft and nontender and nonfocal neurological exam do not feel he requires emergent CT imaging due to the risk for radiation which she expressed understanding for.   But will provide patient's number for oncology and follow-up with a primary care doctor for further work-up of his symptoms  Liver test were normal and chest x-ray reassuring.  Discussed with patient he will continue work-up with primary care doctor     FINAL CLINICAL IMPRESSION(S) / ED DIAGNOSES   Final diagnoses:  Other fatigue     Rx / DC Orders   ED Discharge Orders     None        Note:  This document was prepared using Dragon voice recognition software and may include unintentional dictation errors.   Vanessa [place], MD 03/20/22 1339

## 2022-03-20 NOTE — Telephone Encounter (Signed)
  Chief Complaint: fatigue, weight loss Symptoms: easy bruising, loss of appetite, weight loss, dark circles under eyes Frequency: unsure Pertinent Negatives: Patient denies chest pain, SOB, fever, cough, vomiting diarrhea Disposition: [] ED /[x] Urgent Care (no appt availability in office) / [] Appointment(In office/virtual)/ []  [facility]/ [] Home Care/ [] Refused Recommended Disposition /[]  Mobile Bus/ []  Follow-up with PCP Additional Notes: Assisted with making appt with new PCP- going to [facility] Reason for Disposition  [1] MODERATE weakness (i.e., interferes with work, school, normal activities) AND [2] persists > 3 days  Answer Assessment - Initial Assessment Questions 1. DESCRIPTION: "Describe how you are feeling."     Fatigued and lethargic 2. SEVERITY: "How bad is it?"  "Can you stand and walk?"   - MILD (0-3): Feels weak or tired, but does not interfere with work, school or normal activities.   - MODERATE (4-7): Able to stand and walk; weakness interferes with work, school, or normal activities.   - SEVERE (8-10): Unable to stand or walk; unable to do usual activities.     mild 3. ONSET: "When did these symptoms begin?" (e.g., hours, days, weeks, months)     unsure 4. CAUSE: "What do you think is causing the weakness or fatigue?" (e.g., not drinking enough fluids, medical problem, trouble sleeping)     leukemia 5. NEW MEDICINES:  "Have you started on any new medicines recently?" (e.g., opioid pain medicines, benzodiazepines, muscle relaxants, antidepressants, antihistamines, neuroleptics, beta blockers)     no 6. OTHER SYMPTOMS: "Do you have any other symptoms?" (e.g., chest pain, fever, cough, SOB, vomiting, diarrhea, bleeding, other areas of pain)     Weight loss, easy bruising, loss of appetite, dark circles under eyes 7. PREGNANCY: "Is there any chance you are pregnant?" "When was your last menstrual period?"     N/a  Protocols used: Weakness (Generalized)  and Fatigue-A-AH

## 2022-03-20 NOTE — Discharge Instructions (Addendum)
Your work-up here in the emergency room was reassuring however you should keep a journal of the symptoms that you are having and discuss further with a primary care doctor on the oncology team for further work-up given we can only do certain screens here in the emergency room and are limited on the test that we can do here

## 2022-03-20 NOTE — ED Triage Notes (Signed)
Pt comes with c/o increased fatigue. Pt states he has been having several symtoms for the past 4 years. Pt states dark circles under eyes that has gotten worse and darker over years. Pt states bleeding from gums. Pt was advised to come here and get checked out for cancer.   Pt states family hx of cancer. Pt states he can sleep 10 hours a day.

## 2022-04-02 ENCOUNTER — Ambulatory Visit: Payer: Self-pay | Admitting: Nurse Practitioner

## 2022-04-24 ENCOUNTER — Ambulatory Visit: Payer: 59 | Admitting: Nurse Practitioner

## 2023-05-02 DIAGNOSIS — J209 Acute bronchitis, unspecified: Secondary | ICD-10-CM | POA: Diagnosis not present

## 2023-05-02 DIAGNOSIS — Z20828 Contact with and (suspected) exposure to other viral communicable diseases: Secondary | ICD-10-CM | POA: Diagnosis not present

## 2023-09-22 ENCOUNTER — Ambulatory Visit: Admitting: Pediatrics
